# Patient Record
Sex: Female | Born: 1980
Health system: Southern US, Community
[De-identification: ages and names within clinical notes are randomized; demographics above are authoritative.]

## PROBLEM LIST (undated history)

## (undated) ENCOUNTER — Inpatient Hospital Stay (HOSPITAL_COMMUNITY): Payer: Self-pay

## (undated) DIAGNOSIS — E282 Polycystic ovarian syndrome: Secondary | ICD-10-CM

## (undated) DIAGNOSIS — G43009 Migraine without aura, not intractable, without status migrainosus: Secondary | ICD-10-CM

## (undated) HISTORY — DX: Polycystic ovarian syndrome: E28.2

## (undated) HISTORY — DX: Migraine without aura, not intractable, without status migrainosus: G43.009

---
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
## 1999-11-10 ENCOUNTER — Other Ambulatory Visit: Admission: RE | Admit: 1999-11-10 | Discharge: 1999-11-10 | Payer: Self-pay | Admitting: *Deleted

## 2004-08-18 ENCOUNTER — Other Ambulatory Visit: Admission: RE | Admit: 2004-08-18 | Discharge: 2004-08-18 | Payer: Self-pay | Admitting: *Deleted

## 2005-09-01 ENCOUNTER — Other Ambulatory Visit: Admission: RE | Admit: 2005-09-01 | Discharge: 2005-09-01 | Payer: Self-pay | Admitting: Obstetrics & Gynecology

## 2006-09-21 ENCOUNTER — Other Ambulatory Visit: Admission: RE | Admit: 2006-09-21 | Discharge: 2006-09-21 | Payer: Self-pay | Admitting: Obstetrics and Gynecology

## 2007-10-09 ENCOUNTER — Other Ambulatory Visit: Admission: RE | Admit: 2007-10-09 | Discharge: 2007-10-09 | Payer: Self-pay | Admitting: Obstetrics and Gynecology

## 2008-01-11 ENCOUNTER — Ambulatory Visit: Payer: Self-pay

## 2008-01-11 DIAGNOSIS — M7750 Other enthesopathy of unspecified foot: Secondary | ICD-10-CM | POA: Insufficient documentation

## 2008-01-11 DIAGNOSIS — M2170 Unequal limb length (acquired), unspecified site: Secondary | ICD-10-CM | POA: Insufficient documentation

## 2008-01-11 DIAGNOSIS — M25569 Pain in unspecified knee: Secondary | ICD-10-CM | POA: Insufficient documentation

## 2008-01-13 ENCOUNTER — Encounter: Payer: Self-pay | Admitting: Sports Medicine

## 2008-01-14 ENCOUNTER — Encounter: Payer: Self-pay | Admitting: Sports Medicine

## 2010-06-20 DIAGNOSIS — G43009 Migraine without aura, not intractable, without status migrainosus: Secondary | ICD-10-CM

## 2010-06-20 HISTORY — DX: Migraine without aura, not intractable, without status migrainosus: G43.009

## 2012-10-03 ENCOUNTER — Telehealth: Payer: Self-pay | Admitting: Nurse Practitioner

## 2012-10-03 NOTE — Telephone Encounter (Signed)
Patient needs refill extended  until her appointment.

## 2012-10-03 NOTE — Telephone Encounter (Signed)
S/w pt she said she checked her calender and she will have enough to last her until her AEX 10/25/12

## 2012-10-25 ENCOUNTER — Ambulatory Visit (INDEPENDENT_AMBULATORY_CARE_PROVIDER_SITE_OTHER): Payer: Managed Care, Other (non HMO) | Admitting: Nurse Practitioner

## 2012-10-25 ENCOUNTER — Encounter: Payer: Self-pay | Admitting: Nurse Practitioner

## 2012-10-25 VITALS — BP 122/80 | HR 68 | Ht 64.0 in | Wt 224.0 lb

## 2012-10-25 DIAGNOSIS — Z01419 Encounter for gynecological examination (general) (routine) without abnormal findings: Secondary | ICD-10-CM

## 2012-10-25 MED ORDER — NORETHIN ACE-ETH ESTRAD-FE 1.5-30 MG-MCG PO TABS: 1.0000 | ORAL_TABLET | Freq: Every day | ORAL | Status: DC

## 2012-10-25 NOTE — Patient Instructions (Signed)
General topics  Next pap or exam is  due in 1 year Take a Women's multivitamin Take 1200 mg. of calcium daily - prefer dietary If any concerns in interim to call back  Breast Self-Awareness Practicing breast self-awareness may pick up problems early, prevent significant medical complications, and possibly save your life. By practicing breast self-awareness, you can become familiar with how your breasts look and feel and if your breasts are changing. This allows you to notice changes early. It can also offer you some reassurance that your breast health is good. One way to learn what is normal for your breasts and whether your breasts are changing is to do a breast self-exam. If you find a lump or something that was not present in the past, it is best to contact your caregiver right away. Other findings that should be evaluated by your caregiver include nipple discharge, especially if it is bloody; skin changes or reddening; areas where the skin seems to be pulled in (retracted); or new lumps and bumps. Breast pain is seldom associated with cancer (malignancy), but should also be evaluated by a caregiver. BREAST SELF-EXAM The best time to examine your breasts is 5 7 days after your menstrual period is over.  ExitCare Patient Information 2013 [employer].   Exercise to Stay Healthy Exercise helps you become and stay healthy. EXERCISE IDEAS AND TIPS Choose exercises that:  You enjoy.  Fit into your day. You do not need to exercise really hard to be healthy. You can do exercises at a slow or medium level and stay healthy. You can:  Stretch before and after working out.  Try yoga, Pilates, or tai chi.  Lift weights.  Walk fast, swim, jog, run, climb stairs, bicycle, dance, or rollerskate.  Take aerobic classes. Exercises that burn about 150 calories:  Running 1  miles in 15 minutes.  Playing volleyball for 45 to 60 minutes.  Washing and waxing a car for 45 to 60  minutes.  Playing touch football for 45 minutes.  Walking 1  miles in 35 minutes.  Pushing a stroller 1  miles in 30 minutes.  Playing basketball for 30 minutes.  Raking leaves for 30 minutes.  Bicycling 5 miles in 30 minutes.  Walking 2 miles in 30 minutes.  Dancing for 30 minutes.  Shoveling snow for 15 minutes.  Swimming laps for 20 minutes.  Walking up stairs for 15 minutes.  Bicycling 4 miles in 15 minutes.  Gardening for 30 to 45 minutes.  Jumping rope for 15 minutes.  Washing windows or floors for 45 to 60 minutes. Document Released: 04/09/2010 Document Revised: 05/30/2011 Document Reviewed: 04/09/2010 ExitCare Patient Information 2013 [employer].   Other topics ( that may be useful information):    Sexually Transmitted Disease Sexually transmitted disease (STD) refers to any infection that is passed from person to person during sexual activity. This may happen by way of saliva, semen, blood, vaginal mucus, or urine. Common STDs include:  Gonorrhea.  Chlamydia.  Syphilis.  HIV/AIDS.  Genital herpes.  Hepatitis B and C.  Trichomonas.  Human papillomavirus (HPV).  Pubic lice. CAUSES  An STD may be spread by bacteria, virus, or parasite. A person can get an STD by:  Sexual intercourse with an infected person.  Sharing sex toys with an infected person.  Sharing needles with an infected person.  Having intimate contact with the genitals, mouth, or rectal areas of an infected person. SYMPTOMS  Some people may not have any symptoms, but   they can still pass the infection to others. Different STDs have different symptoms. Symptoms include:  Painful or bloody urination.  Pain in the pelvis, abdomen, vagina, anus, throat, or eyes.  Skin rash, itching, irritation, growths, or sores (lesions). These usually occur in the genital or anal area.  Abnormal vaginal discharge.  Penile discharge in men.  Soft, flesh-colored skin growths in the  genital or anal area.  Fever.  Pain or bleeding during sexual intercourse.  Swollen glands in the groin area.  Yellow skin and eyes (jaundice). This is seen with hepatitis. DIAGNOSIS  To make a diagnosis, your caregiver may:  Take a medical history.  Perform a physical exam.  Take a specimen (culture) to be examined.  Examine a sample of discharge under a microscope.  Perform blood test TREATMENT   Chlamydia, gonorrhea, trichomonas, and syphilis can be cured with antibiotic medicine.  Genital herpes, hepatitis, and HIV can be treated, but not cured, with prescribed medicines. The medicines will lessen the symptoms.  Genital warts from HPV can be treated with medicine or by freezing, burning (electrocautery), or surgery. Warts may come back.  HPV is a virus and cannot be cured with medicine or surgery.However, abnormal areas may be followed very closely by your caregiver and may be removed from the cervix, vagina, or vulva through office procedures or surgery. If your diagnosis is confirmed, your recent sexual partners need treatment. This is true even if they are symptom-free or have a negative culture or evaluation. They should not have sex until their caregiver says it is okay. HOME CARE INSTRUCTIONS  All sexual partners should be informed, tested, and treated for all STDs.  Take your antibiotics as directed. Finish them even if you start to feel better.  Only take over-the-counter or prescription medicines for pain, discomfort, or fever as directed by your caregiver.  Rest.  Eat a balanced diet and drink enough fluids to keep your urine clear or pale yellow.  Do not have sex until treatment is completed and you have followed up with your caregiver. STDs should be checked after treatment.  Keep all follow-up appointments, Pap tests, and blood tests as directed by your caregiver.  Only use latex condoms and water-soluble lubricants during sexual activity. Do not use  petroleum jelly or oils.  Avoid alcohol and illegal drugs.  Get vaccinated for HPV and hepatitis. If you have not received these vaccines in the past, talk to your caregiver about whether one or both might be right for you.  Avoid risky sex practices that can break the skin. The only way to avoid getting an STD is to avoid all sexual activity.Latex condoms and dental dams (for oral sex) will help lessen the risk of getting an STD, but will not completely eliminate the risk. SEEK MEDICAL CARE IF:   You have a fever.  You have any new or worsening symptoms. Document Released: 05/28/2002 Document Revised: 05/30/2011 Document Reviewed: 06/04/2010 ExitCare Patient Information 2013 [employer].    Domestic Abuse You are being battered or abused if someone close to you hits, pushes, or physically hurts you in any way. You also are being abused if you are forced into activities. You are being sexually abused if you are forced to have sexual contact of any kind. You are being emotionally abused if you are made to feel worthless or if you are constantly threatened. It is important to remember that help is available. No one has the right to abuse you. PREVENTION OF FURTHER   ABUSE  Learn the warning signs of danger. This varies with situations but may include: the use of alcohol, threats, isolation from friends and family, or forced sexual contact. Leave if you feel that violence is going to occur.  If you are attacked or beaten, report it to the police so the abuse is documented. You do not have to press charges. The police can protect you while you or the attackers are leaving. Get the officer's name and badge number and a copy of the report.  Find someone you can trust and tell them what is happening to you: your caregiver, a nurse, clergy member, close friend or family member. Feeling ashamed is natural, but remember that you have done nothing wrong. No one deserves abuse. Document Released:  03/04/2000 Document Revised: 05/30/2011 Document Reviewed: 05/13/2010 ExitCare Patient Information 2013 [employer].    How Much is Too Much Alcohol? Drinking too much alcohol can cause injury, accidents, and health problems. These types of problems can include:   Car crashes.  Falls.  Family fighting (domestic violence).  Drowning.  Fights.  Injuries.  Burns.  Damage to certain organs.  Having a baby with birth defects. ONE DRINK CAN BE TOO MUCH WHEN YOU ARE:  Working.  Pregnant or breastfeeding.  Taking medicines. Ask your doctor.  Driving or planning to drive. If you or someone you know has a drinking problem, get help from a doctor.  Document Released: 01/01/2009 Document Revised: 05/30/2011 Document Reviewed: 01/01/2009 ExitCare Patient Information 2013 [employer].   Smoking Hazards Smoking cigarettes is extremely bad for your health. Tobacco smoke has over 200 known poisons in it. There are over 60 chemicals in tobacco smoke that cause cancer. Some of the chemicals found in cigarette smoke include:   Cyanide.  Benzene.  Formaldehyde.  Methanol (wood alcohol).  Acetylene (fuel used in welding torches).  Ammonia. Cigarette smoke also contains the poisonous gases nitrogen oxide and carbon monoxide.  Cigarette smokers have an increased risk of many serious medical problems and Smoking causes approximately:  90% of all lung cancer deaths in men.  80% of all lung cancer deaths in women.  90% of deaths from chronic obstructive lung disease. Compared with nonsmokers, smoking increases the risk of:  Coronary heart disease by 2 to 4 times.  Stroke by 2 to 4 times.  Men developing lung cancer by 23 times.  Women developing lung cancer by 13 times.  Dying from chronic obstructive lung diseases by 12 times.  . Smoking is the most preventable cause of death and disease in our society.  WHY IS SMOKING ADDICTIVE?  Nicotine is the chemical  agent in tobacco that is capable of causing addiction or dependence.  When you smoke and inhale, nicotine is absorbed rapidly into the bloodstream through your lungs. Nicotine absorbed through the lungs is capable of creating a powerful addiction. Both inhaled and non-inhaled nicotine may be addictive.  Addiction studies of cigarettes and spit tobacco show that addiction to nicotine occurs mainly during the teen years, when young people begin using tobacco products. WHAT ARE THE BENEFITS OF QUITTING?  There are many health benefits to quitting smoking.   Likelihood of developing cancer and heart disease decreases. Health improvements are seen almost immediately.  Blood pressure, pulse rate, and breathing patterns start returning to normal soon after quitting. QUITTING SMOKING   American Lung Association - 1-800-LUNGUSA  American Cancer Society - 1-800-ACS-2345 Document Released: 04/14/2004 Document Revised: 05/30/2011 Document Reviewed: 12/17/2008 ExitCare Patient Information 2013 [employer].   Stress Management Stress is a state of physical or mental tension that often results from changes in your life or normal routine. Some common causes of stress are:  Death of a loved one.  Injuries or severe illnesses.  Getting fired or changing jobs.  Moving into a new home. Other causes may be:  Sexual problems.  Business or financial losses.  Taking on a large debt.  Regular conflict with someone at home or at work.  Constant tiredness from lack of sleep. It is not just bad things that are stressful. It may be stressful to:  Win the lottery.  Get married.  Buy a new car. The amount of stress that can be easily tolerated varies from person to person. Changes generally cause stress, regardless of the types of change. Too much stress can affect your health. It may lead to physical or emotional problems. Too little stress (boredom) may also become stressful. SUGGESTIONS TO  REDUCE STRESS:  Talk things over with your family and friends. It often is helpful to share your concerns and worries. If you feel your problem is serious, you may want to get help from a professional counselor.  Consider your problems one at a time instead of lumping them all together. Trying to take care of everything at once may seem impossible. List all the things you need to do and then start with the most important one. Set a goal to accomplish 2 or 3 things each day. If you expect to do too many in a single day you will naturally fail, causing you to feel even more stressed.  Do not use alcohol or drugs to relieve stress. Although you may feel better for a short time, they do not remove the problems that caused the stress. They can also be habit forming.  Exercise regularly - at least 3 times per week. Physical exercise can help to relieve that "uptight" feeling and will relax you.  The shortest distance between despair and hope is often a good night's sleep.  Go to bed and get up on time allowing yourself time for appointments without being rushed.  Take a short "time-out" period from any stressful situation that occurs during the day. Close your eyes and take some deep breaths. Starting with the muscles in your face, tense them, hold it for a few seconds, then relax. Repeat this with the muscles in your neck, shoulders, hand, stomach, back and legs.  Take good care of yourself. Eat a balanced diet and get plenty of rest.  Schedule time for having fun. Take a break from your daily routine to relax. HOME CARE INSTRUCTIONS   Call if you feel overwhelmed by your problems and feel you can no longer manage them on your own.  Return immediately if you feel like hurting yourself or someone else. Document Released: 08/31/2000 Document Revised: 05/30/2011 Document Reviewed: 04/23/2007 ExitCare Patient Information 2013 [employer].   

## 2012-10-25 NOTE — Progress Notes (Signed)
Patient ID: Diamond Hunter, female   DOB: 01-19-1981, 32 y.o.   MRN: [id number] 32 y.o. G0 Married Caucasian Fe here for annual exam.    Patient's last menstrual period was 10/22/2012.          Sexually active: yes  The current method of family planning is OCP (estrogen/progesterone).    Exercising: no  The patient does not participate in regular exercise at present. Smoker:  no  Health Maintenance: Pap:  8/13= Normal with negative HR HPV TDaP:  2011 Gardasil completed 03/03/06 Labs: HB: 14.0 Urine: lg RBC-on menses   reports that she has never smoked. She has never used smokeless tobacco. She reports that she drinks about 2.5 ounces of alcohol per week. She reports that she does not use illicit drugs.  History reviewed. No pertinent past medical history.  History reviewed. No pertinent past surgical history.  Current Outpatient Prescriptions  Medication Sig Dispense Refill  . norethindrone-ethinyl estradiol-iron (MICROGESTIN FE,GILDESS FE,LOESTRIN FE) 1.5-30 MG-MCG tablet Take 1 tablet by mouth daily.       No current facility-administered medications for this visit.    Family History  Problem Relation Age of Onset  . Cancer Father     bladder cancer    ROS:  Pertinent items are noted in HPI.  Otherwise, a comprehensive ROS was negative.  Exam:   BP 122/80  Pulse 68  Ht 5\' 4"  (1.626 m)  Wt 224 lb (101.606 kg)  BMI 38.43 kg/m2  LMP 10/22/2012 Height: 5\' 4"  (162.6 cm)  Ht Readings from Last 3 Encounters:  10/25/12 5\' 4"  (1.626 m)    General appearance: alert, cooperative and appears stated age Head: Normocephalic, without obvious abnormality, atraumatic Neck: no adenopathy, supple, symmetrical, trachea midline and thyroid normal to inspection and palpation Lungs: clear to auscultation bilaterally Breasts: normal appearance, no masses or tenderness Heart: regular rate and rhythm Abdomen: soft, non-tender; no masses,  no organomegaly Extremities: extremities normal,  atraumatic, no cyanosis or edema Skin: Skin color, texture, turgor normal. No rashes or lesions Lymph nodes: Cervical, supraclavicular, and axillary nodes normal. No abnormal inguinal nodes palpated Neurologic: Grossly normal   Pelvic: External genitalia:  no lesions              Urethra:  normal appearing urethra with no masses, tenderness or lesions              Bartholin's and Skene's: normal                 Vagina: normal appearing vagina with normal color and discharge, no lesions              Cervix: anteverted              Pap taken: no Bimanual Exam:  Uterus:  normal size, contour, position, consistency, mobility, non-tender              Adnexa: no mass, fullness, tenderness               Rectovaginal: Confirms               Anus:  normal sphincter tone, no lesions  A:  Well Woman with normal exam  OCP for contraception  History of PCOS  P:   Pap smear as per guidelines  Refill on Microgestin for 1 year  Answered questions about future pregnancy and PCOS   Counseled on breast self exam, adequate intake of calcium and vitamin D, diet and exercise return annually or  prn  An After Visit Summary was printed and given to the patient.

## 2012-10-26 NOTE — Progress Notes (Signed)
Encounter reviewed by Dr. Brook Silva.  

## 2013-09-25 ENCOUNTER — Other Ambulatory Visit: Payer: Self-pay | Admitting: Nurse Practitioner

## 2013-09-25 NOTE — Telephone Encounter (Signed)
Last AEX: 10/25/12 Last refill: 10/25/12 # 84, 3 refills Current AEX: 10/28/13  Spoke to pt and she stated she just needed 1 mth to cover until her AEX in Aug.  Encounter closed

## 2013-10-28 ENCOUNTER — Encounter: Payer: Self-pay | Admitting: Nurse Practitioner

## 2013-10-28 ENCOUNTER — Ambulatory Visit (INDEPENDENT_AMBULATORY_CARE_PROVIDER_SITE_OTHER): Payer: Managed Care, Other (non HMO) | Admitting: Nurse Practitioner

## 2013-10-28 VITALS — BP 116/72 | HR 64 | Ht 64.0 in | Wt 223.0 lb

## 2013-10-28 DIAGNOSIS — Z0000 Encounter for general adult medical examination without abnormal findings: Secondary | ICD-10-CM

## 2013-10-28 DIAGNOSIS — Z01419 Encounter for gynecological examination (general) (routine) without abnormal findings: Secondary | ICD-10-CM

## 2013-10-28 DIAGNOSIS — D508 Other iron deficiency anemias: Secondary | ICD-10-CM

## 2013-10-28 LAB — POCT URINALYSIS DIPSTICK
Bilirubin, UA: NEGATIVE
Blood, UA: NEGATIVE
Glucose, UA: NEGATIVE
Ketones, UA: NEGATIVE
LEUKOCYTES UA: NEGATIVE
Nitrite, UA: NEGATIVE
PROTEIN UA: NEGATIVE
UROBILINOGEN UA: NEGATIVE
pH, UA: 7

## 2013-10-28 LAB — CBC
HEMATOCRIT: 41.8 % (ref 36.0–46.0)
Hemoglobin: 13.7 g/dL (ref 12.0–15.0)
MCH: 30.4 pg (ref 26.0–34.0)
MCHC: 32.8 g/dL (ref 30.0–36.0)
MCV: 92.7 fL (ref 78.0–100.0)
Platelets: 326 10*3/uL (ref 150–400)
RBC: 4.51 MIL/uL (ref 3.87–5.11)
RDW: 13 % (ref 11.5–15.5)
WBC: 5.8 10*3/uL (ref 4.0–10.5)

## 2013-10-28 LAB — HEMOGLOBIN, FINGERSTICK: HEMOGLOBIN, FINGERSTICK: 9.3 g/dL — AB (ref 12.0–16.0)

## 2013-10-28 MED ORDER — MICROGESTIN 1/20 1-20 MG-MCG PO TABS: ORAL_TABLET | ORAL | Status: DC

## 2013-10-28 NOTE — Patient Instructions (Signed)

## 2013-10-28 NOTE — Progress Notes (Signed)
Patient ID: Diamond KiltsKathryn J Hunter, female   DOB: 1980/07/23, 33 y.o.   MRN: [id number] [age] y.o. G0P0 Married Caucasian Fe here for annual exam.  Menses on Microgestin  lasting 4 days with 1 day heavy.  On Gildess menses is 7 days with 2 days heavier and increase of HA's.  Same partner for 3 years.  will  Be going back to school in 2 years and take interior design.  They are going to postpone planning a family  Patient's last menstrual period was 10/21/2013.          Sexually active: Yes.    The current method of family planning is OCP (estrogen/progesterone).    Exercising: Yes.    Gym/ health club routine includes yoga and pilates and walking. Smoker:  no  Health Maintenance: Pap:  8/13= Normal with negative HR HPV TDaP:  2011 Gardasil:  Completed 03/03/06 Labs: HB:  9.3  Urine:  Negative    reports that she has never smoked. She has never used smokeless tobacco. She reports that she drinks about 2.5 ounces of alcohol per week. She reports that she does not use illicit drugs.  Past Medical History  Diagnosis Date  . Migraine headache without aura 06/2010  . PCOS (polycystic ovarian syndrome)     suspected    Past Surgical History  Procedure Laterality Date  . Wisdom tooth extraction  1995    Current Outpatient Prescriptions  Medication Sig Dispense Refill  . GILDESS FE 1.5/30 1.5-30 MG-MCG tablet TAKE 1 TABLET BY MOUTH DAILY  1 Package  0   No current facility-administered medications for this visit.    Family History  Problem Relation Age of Onset  . Bladder Cancer Father 2555    bladder cancer  . Heart failure Maternal Grandmother   . Diabetes Maternal Grandmother   . Heart failure Paternal Grandmother   . Hyperlipidemia Paternal Grandmother   . Hypertension Paternal Grandmother   . Lung cancer Paternal Grandfather     smoker    ROS:  Pertinent items are noted in HPI.  Otherwise, a comprehensive ROS was negative.  Exam:   BP 116/72  Pulse 64  Ht 5\' 4"  (1.626 m)  Wt 223  lb (101.152 kg)  BMI 38.26 kg/m2  LMP 10/21/2013 Height: 5\' 4"  (162.6 cm)  Ht Readings from Last 3 Encounters:  10/28/13 5\' 4"  (1.626 m)  10/25/12 5\' 4"  (1.626 m)    General appearance: alert, cooperative and appears stated age Head: Normocephalic, without obvious abnormality, atraumatic Neck: no adenopathy, supple, symmetrical, trachea midline and thyroid normal to inspection and palpation Lungs: clear to auscultation bilaterally Breasts: normal appearance, no masses or tenderness Heart: regular rate and rhythm Abdomen: soft, non-tender; no masses,  no organomegaly Extremities: extremities normal, atraumatic, no cyanosis or edema Skin: Skin color, texture, turgor normal. No rashes or lesions Lymph nodes: Cervical, supraclavicular, and axillary nodes normal. No abnormal inguinal nodes palpated Neurologic: Grossly normal   Pelvic: External genitalia:  no lesions              Urethra:  normal appearing urethra with no masses, tenderness or lesions              Bartholin's and Skene's: normal                 Vagina: normal appearing vagina with normal color and discharge, no lesions              Cervix: anteverted  Pap taken: No. Bimanual Exam:  Uterus:  normal size, contour, position, consistency, mobility, non-tender              Adnexa: no mass, fullness, tenderness               Rectovaginal: Confirms               Anus:  normal sphincter tone, no lesions  A:  Well Woman with normal exam  OCP for contraception  History of PCOS  R/O anemia  P:   Reviewed health and wellness pertinent to exam  Pap smear not taken today  Refill on Microgestin 1/20 daily for a year DAW  Will follow with iron studies  Counseled on breast self exam, adequate intake of calcium and vitamin D, diet and exercise return annually or prn  An After Visit Summary was printed and given to the patient.

## 2013-10-29 LAB — IBC PANEL
%SAT: 55 % (ref 20–55)
TIBC: 394 ug/dL (ref 250–470)
UIBC: 178 ug/dL (ref 125–400)

## 2013-10-29 LAB — FERRITIN: Ferritin: 27 ng/mL (ref 10–291)

## 2013-10-29 LAB — IRON: Iron: 216 ug/dL — ABNORMAL HIGH (ref 42–145)

## 2013-10-30 ENCOUNTER — Ambulatory Visit: Payer: Managed Care, Other (non HMO) | Admitting: Nurse Practitioner

## 2013-11-03 NOTE — Progress Notes (Signed)
Encounter reviewed by Dr. Brook Silva.  

## 2014-01-27 ENCOUNTER — Ambulatory Visit (INDEPENDENT_AMBULATORY_CARE_PROVIDER_SITE_OTHER): Payer: Managed Care, Other (non HMO) | Admitting: Nurse Practitioner

## 2014-01-27 ENCOUNTER — Encounter: Payer: Self-pay | Admitting: Emergency Medicine

## 2014-01-27 ENCOUNTER — Emergency Department (HOSPITAL_COMMUNITY)
Admission: EM | Admit: 2014-01-27 | Discharge: 2014-01-27 | Disposition: A | Discharge status: Home / Self Care | Payer: Managed Care, Other (non HMO) | Attending: Emergency Medicine | Admitting: Emergency Medicine

## 2014-01-27 ENCOUNTER — Encounter (HOSPITAL_COMMUNITY): Payer: Self-pay

## 2014-01-27 ENCOUNTER — Emergency Department (HOSPITAL_COMMUNITY): Payer: Managed Care, Other (non HMO)

## 2014-01-27 ENCOUNTER — Encounter: Payer: Self-pay | Admitting: Nurse Practitioner

## 2014-01-27 ENCOUNTER — Telehealth: Payer: Self-pay

## 2014-01-27 VITALS — BP 122/64 | HR 64 | Temp 98.8°F | Ht 64.0 in | Wt 228.0 lb

## 2014-01-27 DIAGNOSIS — R1031 Right lower quadrant pain: Secondary | ICD-10-CM

## 2014-01-27 DIAGNOSIS — R110 Nausea: Secondary | ICD-10-CM | POA: Diagnosis not present

## 2014-01-27 DIAGNOSIS — Z793 Long term (current) use of hormonal contraceptives: Secondary | ICD-10-CM | POA: Insufficient documentation

## 2014-01-27 DIAGNOSIS — E282 Polycystic ovarian syndrome: Secondary | ICD-10-CM | POA: Insufficient documentation

## 2014-01-27 DIAGNOSIS — R197 Diarrhea, unspecified: Secondary | ICD-10-CM | POA: Diagnosis not present

## 2014-01-27 DIAGNOSIS — E669 Obesity, unspecified: Secondary | ICD-10-CM | POA: Diagnosis not present

## 2014-01-27 DIAGNOSIS — R102 Pelvic and perineal pain: Secondary | ICD-10-CM

## 2014-01-27 DIAGNOSIS — Z8679 Personal history of other diseases of the circulatory system: Secondary | ICD-10-CM | POA: Diagnosis not present

## 2014-01-27 DIAGNOSIS — R630 Anorexia: Secondary | ICD-10-CM | POA: Insufficient documentation

## 2014-01-27 LAB — CBC WITH DIFFERENTIAL/PLATELET
BASOS ABS: 0 10*3/uL (ref 0.0–0.1)
BASOS PCT: 0 % (ref 0–1)
EOS ABS: 0.1 10*3/uL (ref 0.0–0.7)
Eosinophils Relative: 1 % (ref 0–5)
HCT: 40.4 % (ref 36.0–46.0)
HEMOGLOBIN: 13.5 g/dL (ref 12.0–15.0)
Lymphocytes Relative: 41 % (ref 12–46)
Lymphs Abs: 3.3 10*3/uL (ref 0.7–4.0)
MCH: 30.4 pg (ref 26.0–34.0)
MCHC: 33.4 g/dL (ref 30.0–36.0)
MCV: 91 fL (ref 78.0–100.0)
Monocytes Absolute: 0.5 10*3/uL (ref 0.1–1.0)
Monocytes Relative: 6 % (ref 3–12)
NEUTROS PCT: 52 % (ref 43–77)
Neutro Abs: 4.2 10*3/uL (ref 1.7–7.7)
PLATELETS: 362 10*3/uL (ref 150–400)
RBC: 4.44 MIL/uL (ref 3.87–5.11)
RDW: 12.2 % (ref 11.5–15.5)
WBC: 8.1 10*3/uL (ref 4.0–10.5)

## 2014-01-27 LAB — POCT URINALYSIS DIPSTICK
BILIRUBIN UA: NEGATIVE
GLUCOSE UA: NEGATIVE
KETONES UA: NEGATIVE
Nitrite, UA: NEGATIVE
PH UA: 6
Protein, UA: NEGATIVE
Urobilinogen, UA: NEGATIVE

## 2014-01-27 LAB — COMPREHENSIVE METABOLIC PANEL
ALBUMIN: 3.7 g/dL (ref 3.5–5.2)
ALK PHOS: 39 U/L (ref 39–117)
ALT: 12 U/L (ref 0–35)
ANION GAP: 14 (ref 5–15)
AST: 17 U/L (ref 0–37)
BUN: 12 mg/dL (ref 6–23)
CO2: 21 mEq/L (ref 19–32)
Calcium: 9.5 mg/dL (ref 8.4–10.5)
Chloride: 106 mEq/L (ref 96–112)
Creatinine, Ser: 0.74 mg/dL (ref 0.50–1.10)
GFR calc Af Amer: >90 mL/min (ref >90)
GFR calc non Af Amer: >90 mL/min (ref >90)
Glucose, Bld: 90 mg/dL (ref 70–99)
POTASSIUM: 3.9 meq/L (ref 3.7–5.3)
Sodium: 141 mEq/L (ref 137–147)
TOTAL PROTEIN: 7.3 g/dL (ref 6.0–8.3)
Total Bilirubin: 0.3 mg/dL (ref 0.3–1.2)

## 2014-01-27 LAB — LIPASE, BLOOD: Lipase: 42 U/L (ref 11–59)

## 2014-01-27 LAB — POCT URINE PREGNANCY: Preg Test, Ur: NEGATIVE

## 2014-01-27 MED ORDER — IOHEXOL 300 MG/ML  SOLN
100.0000 mL | Freq: Once | INTRAMUSCULAR | Status: AC | PRN
  Administered 2014-01-27: 100 mL via INTRAVENOUS

## 2014-01-27 MED ORDER — MORPHINE SULFATE 4 MG/ML IJ SOLN
4.0000 mg | Freq: Once | INTRAMUSCULAR | Status: DC
  Filled 2014-01-27: qty 1

## 2014-01-27 MED ORDER — SODIUM CHLORIDE 0.9 % IV BOLUS (SEPSIS)
1000.0000 mL | Freq: Once | INTRAVENOUS | Status: AC
Start: 2014-01-27 — End: 2014-01-27
  Administered 2014-01-27: 1000 mL via INTRAVENOUS

## 2014-01-27 MED ORDER — IOHEXOL 300 MG/ML  SOLN
50.0000 mL | Freq: Once | INTRAMUSCULAR | Status: AC | PRN
  Administered 2014-01-27: 50 mL via ORAL

## 2014-01-27 MED ORDER — ONDANSETRON HCL 4 MG/2ML IJ SOLN: 4.0000 mg | Freq: Once | INTRAMUSCULAR | Status: DC

## 2014-01-27 NOTE — ED Provider Notes (Signed)
The patient presents with abdominal pain specifically in the right lower quadrant, this is much better since earlier today, she was seen at a gynecologist appointment, had some hematuria on urinalysis and was sent to the emergency department for further evaluation. The patient does not have a leukocytosis and on exam she has minimal right lower quadrant pain but this is persistent. She is not expected to have her menstrual cycle for another week. She does have pain at the right lower quadrant at McBurney's point, there is no guarding, no other signs including no Rovsing sign, no rigidity and no surgical abdomen. We'll pursue CT scan to rule out other sources of pain, patient appears hemodynamically stable at this time.  Medical screening examination/treatment/procedure(s) were conducted as a shared visit with non-physician practitioner(s) and myself.  I personally evaluated the patient during the encounter.  Clinical Impression:   Final diagnoses:  RLQ abdominal pain  RLQ abdominal pain         Vida RollerBrian D Jaynia Fendley, MD 01/28/14 [phone]

## 2014-01-27 NOTE — ED Notes (Addendum)
Pt c/o dull lower abdominal pain x 1 week and intermittent, sharp, RLQ pain starting this morning.  Pain score 5/10.  Denies n/v/d.  [facility] and sent to ED to r/o appendicitis.

## 2014-01-27 NOTE — Discharge Instructions (Signed)
Abdominal Pain, Women °Abdominal (stomach, pelvic, or belly) pain can be caused by many things. It is important to tell your doctor: °· The location of the pain. °· Does it come and go or is it present all the time? °· Are there things that start the pain (eating certain foods, exercise)? °· Are there other symptoms associated with the pain (fever, nausea, vomiting, diarrhea)? °All of this is helpful to know when trying to find the cause of the pain. °CAUSES  °· Stomach: virus or bacteria infection, or ulcer. °· Intestine: appendicitis (inflamed appendix), regional ileitis (Crohn's disease), ulcerative colitis (inflamed colon), irritable bowel syndrome, diverticulitis (inflamed diverticulum of the colon), or cancer of the stomach or intestine. °· Gallbladder disease or stones in the gallbladder. °· Kidney disease, kidney stones, or infection. °· Pancreas infection or cancer. °· Fibromyalgia (pain disorder). °· Diseases of the female organs: °¨ Uterus: fibroid (non-cancerous) tumors or infection. °¨ Fallopian tubes: infection or tubal pregnancy. °¨ Ovary: cysts or tumors. °¨ Pelvic adhesions (scar tissue). °¨ Endometriosis (uterus lining tissue growing in the pelvis and on the pelvic organs). °¨ Pelvic congestion syndrome (female organs filling up with blood just before the menstrual period). °¨ Pain with the menstrual period. °¨ Pain with ovulation (producing an egg). °¨ Pain with an IUD (intrauterine device, birth control) in the uterus. °¨ Cancer of the female organs. °· Functional pain (pain not caused by a disease, may improve without treatment). °· Psychological pain. °· Depression. °DIAGNOSIS  °Your doctor will decide the seriousness of your pain by doing an examination. °· Blood tests. °· X-rays. °· Ultrasound. °· CT scan (computed tomography, special type of X-ray). °· MRI (magnetic resonance imaging). °· Cultures, for infection. °· Barium enema (dye inserted in the large intestine, to better view it with  X-rays). °· Colonoscopy (looking in intestine with a lighted tube). °· Laparoscopy (minor surgery, looking in abdomen with a lighted tube). °· Major abdominal exploratory surgery (looking in abdomen with a large incision). °TREATMENT  °The treatment will depend on the cause of the pain.  °· Many cases can be observed and treated at home. °· Over-the-counter medicines recommended by your caregiver. °· Prescription medicine. °· Antibiotics, for infection. °· Birth control pills, for painful periods or for ovulation pain. °· Hormone treatment, for endometriosis. °· Nerve blocking injections. °· Physical therapy. °· Antidepressants. °· Counseling with a psychologist or psychiatrist. °· Minor or major surgery. °HOME CARE INSTRUCTIONS  °· Do not take laxatives, unless directed by your caregiver. °· Take over-the-counter pain medicine only if ordered by your caregiver. Do not take aspirin because it can cause an upset stomach or bleeding. °· Try a clear liquid diet (broth or water) as ordered by your caregiver. Slowly move to a bland diet, as tolerated, if the pain is related to the stomach or intestine. °· Have a thermometer and take your temperature several times a day, and record it. °· Bed rest and sleep, if it helps the pain. °· Avoid sexual intercourse, if it causes pain. °· Avoid stressful situations. °· Keep your follow-up appointments and tests, as your caregiver orders. °· If the pain does not go away with medicine or surgery, you may try: °¨ Acupuncture. °¨ Relaxation exercises (yoga, meditation). °¨ Group therapy. °¨ Counseling. °SEEK MEDICAL CARE IF:  °· You notice certain foods cause stomach pain. °· Your home care treatment is not helping your pain. °· You need stronger pain medicine. °· You want your IUD removed. °· You feel faint or   lightheaded. °· You develop nausea and vomiting. °· You develop a rash. °· You are having side effects or an allergy to your medicine. °SEEK IMMEDIATE MEDICAL CARE IF:  °· Your  pain does not go away or gets worse. °· You have a fever. °· Your pain is felt only in portions of the abdomen. The right side could possibly be appendicitis. The left lower portion of the abdomen could be colitis or diverticulitis. °· You are passing blood in your stools (bright red or black tarry stools, with or without vomiting). °· You have blood in your urine. °· You develop chills, with or without a fever. °· You pass out. °MAKE SURE YOU:  °· Understand these instructions. °· Will watch your condition. °· Will get help right away if you are not doing well or get worse. °Document Released: 01/02/2007 Document Revised: 07/22/2013 Document Reviewed: 01/22/2009 °ExitCare® Patient Information ©2015 [employer]. This information is not intended to replace advice given to you by your health care provider. Make sure you discuss any questions you have with your health care provider. ° °

## 2014-01-27 NOTE — Progress Notes (Signed)
[facility] and advised charge RN Alena patient coming via personal vehicle for evaluation of RLQ pain/Pelvic pain. UPT negative. LMP 01/07/14.

## 2014-01-27 NOTE — Progress Notes (Signed)
Subjective:     Patient ID: Diamond Hunter, female   DOB: 03-06-1981, 33 y.o.   MRN: [id number]  HPI  This 33 yo G0P0 WM Fe complains of pain lower pelvic for a week.  Pain initially has been dull achy with associated bloating.  She thought maybe stress of working and going to school was the cause of pain.  Her BM's remained the same daily without constipation or increase in flatus.  This am pain was sharp and stabbing but intermittent. She remains on OCP without missed doses.  LMP 01/16/14 with normal flow.  Last SA this past Friday without pain or discomfort.  She went to a wedding on Saturday and remained busy on her feet but this did not cause pain to worsen. Some spotting on Saturday with wiping only.   She denies any urinary symptoms.  No prior history of renal calculi.  She normally does not complain, so today when she called we felt like she was having significant symptoms to warrant an evaluation.  Semi soft BM this am and pain got worse right lower quadrant.   Review of Systems  Constitutional: Negative for fever, chills, appetite change and fatigue.  Respiratory: Negative.   Cardiovascular: Negative.   Gastrointestinal: Positive for abdominal pain. Negative for nausea, vomiting, diarrhea, constipation, blood in stool, abdominal distention, anal bleeding and rectal pain.  Genitourinary: Negative for urgency, frequency, hematuria, flank pain, vaginal bleeding, vaginal discharge, enuresis, vaginal pain, menstrual problem, pelvic pain and dyspareunia.  Musculoskeletal: Positive for back pain. Negative for myalgias, joint swelling and neck pain.       Chronic back pain that is unchanged  Skin: Negative.   Neurological: Negative.   Psychiatric/Behavioral: Negative.        Objective:   Physical Exam  Constitutional: She is oriented to person, place, and time. She appears well-developed and well-nourished. She appears distressed.  Cardiovascular: Normal rate.   Pulmonary/Chest: Effort  normal.  Abdominal: Soft. Bowel sounds are normal. She exhibits no distension and no mass. There is tenderness. There is rebound and guarding.  Genitourinary:  Normal vaginal discharge without bleeding.  Musculoskeletal: Normal range of motion.  Neurological: She is alert and oriented to person, place, and time.  Skin: Skin is warm and dry.  Psychiatric: She has a normal mood and affect. Her behavior is normal. Judgment and thought content normal.       Assessment:     RLQ pain ? etiology    Plan:     Discussed with Dr. Hyacinth MeekerMiller and will [facility] for evaluation for possible renal calculi or appendicitis.

## 2014-01-27 NOTE — ED Provider Notes (Signed)
CSN: [id number]     Arrival date & time 01/27/14  1516 History   First MD Initiated Contact with Patient 01/27/14 1533     Chief Complaint  Patient presents with  . Abdominal Pain     (Consider location/radiation/quality/duration/timing/severity/associated sxs/prior Treatment) HPI Comments: Patient is a 33 year old female with history of PCOS and migraines who presents to the ED for evaluation of abdominal pain. She reports that she has had dull pain in her lower abdomen for the past 5 days. Today the pain became sharp in her RLQ and she went to see her GYN physician. At that time her GYN physician did a pelvic exam and felt as though she needed to have appendicitis ruled out. She reports associated decreased appetite and nausea. She had one loose stool this morning. Patient denies fevers, chills.   The history is provided by the patient. No language interpreter was used.    Past Medical History  Diagnosis Date  . Migraine headache without aura 06/2010  . PCOS (polycystic ovarian syndrome)     suspected   Past Surgical History  Procedure Laterality Date  . Wisdom tooth extraction  1995   Family History  Problem Relation Age of Onset  . Bladder Cancer Father [id number]    bladder cancer  . Heart failure Maternal Grandmother   . Diabetes Maternal Grandmother   . Heart failure Paternal Grandmother   . Hyperlipidemia Paternal Grandmother   . Hypertension Paternal Grandmother   . Lung cancer Paternal Grandfather     smoker   History  Substance Use Topics  . Smoking status: Never Smoker   . Smokeless tobacco: Never Used  . Alcohol Use: 2.5 oz/week    5 drink(s) per week   OB History    Gravida Para Term Preterm AB TAB SAB Ectopic Multiple Living   0              Review of Systems  Constitutional: Positive for appetite change. Negative for fever and chills.  Respiratory: Negative for shortness of breath.   Cardiovascular: Negative for chest pain.  Gastrointestinal: Positive for  nausea, abdominal pain and diarrhea. Negative for vomiting.  Genitourinary: Negative for dysuria, vaginal bleeding and vaginal discharge.  All other systems reviewed and are negative.     Allergies  Review of patient's allergies indicates no known allergies.  Home Medications   Prior to Admission medications   Medication Sig Start Date End Date Taking? Authorizing Provider  MICROGESTIN 1-20 MG-MCG tablet Take continous active pills.  3 months = 4 pack 10/28/13   Patricia Rolen-Grubb, FNP   BP 136/75 mmHg  Pulse 78  Temp(Src) 98.1 F (36.7 C) (Oral)  Resp 16  SpO2 99%  LMP 01/07/2014 Physical Exam  Constitutional: She is oriented to person, place, and time. She appears well-developed and well-nourished. No distress.  obese  HENT:  Head: Normocephalic and atraumatic.  Right Ear: External ear normal.  Left Ear: External ear normal.  Nose: Nose normal.  Mouth/Throat: Oropharynx is clear and moist.  Eyes: Conjunctivae are normal.  Neck: Normal range of motion.  Cardiovascular: Normal rate, regular rhythm and normal heart sounds.   Pulmonary/Chest: Effort normal and breath sounds normal. No stridor. No respiratory distress. She has no wheezes. She has no rales.  Abdominal: Soft. She exhibits no distension. There is tenderness in the right lower quadrant. There is no rigidity and no guarding.  Musculoskeletal: Normal range of motion.  Neurological: She is alert and oriented to person, place,  and time. She has normal strength.  Skin: Skin is warm and dry. She is not diaphoretic. No erythema.  Psychiatric: She has a normal mood and affect. Her behavior is normal.  Nursing note and vitals reviewed.   ED Course  Procedures (including critical care time) Labs Review Labs Reviewed  CBC WITH DIFFERENTIAL  COMPREHENSIVE METABOLIC PANEL  LIPASE, BLOOD    Imaging Review Ct Abdomen Pelvis W Contrast  01/27/2014   CLINICAL DATA:  Right lower quadrant sharp stabbing pain for 2 days.   EXAM: CT ABDOMEN AND PELVIS WITH CONTRAST  TECHNIQUE: Multidetector CT imaging of the abdomen and pelvis was performed using the standard protocol following bolus administration of intravenous contrast.  CONTRAST:  50mL OMNIPAQUE IOHEXOL 300 MG/ML SOLN, 100mL OMNIPAQUE IOHEXOL 300 MG/ML SOLN  COMPARISON:  None.  FINDINGS: BODY WALL: Unremarkable.  LOWER CHEST: Unremarkable.  ABDOMEN/PELVIS:  Liver: No focal abnormality.  Biliary: No evidence of biliary obstruction or stone.  Pancreas: Unremarkable.  Spleen: Unremarkable.  Adrenals: Unremarkable.  Kidneys and ureters: No hydronephrosis or stone.  Bladder: Unremarkable.  Reproductive: Unremarkable.  Bowel: No obstruction. No inflammatory bowel wall thickening. Negative appendix.  Retroperitoneum: No mass or adenopathy.  Peritoneum: No ascites or pneumoperitoneum.  Vascular: No acute abnormality.  OSSEOUS: No acute abnormalities.  IMPRESSION: No findings to explain right lower quadrant pain. Negative appendix.   Electronically Signed   By: Tiburcio PeaJonathan  Watts M.D.   On: 01/27/2014 18:41     EKG Interpretation None      MDM   Final diagnoses:  RLQ abdominal pain  RLQ abdominal pain    Patient presents to emergency department for evaluation of right lower abdominal pain. Negative pregnancy, UA not infected earlier today. CT shows no acute abnormalities. Normal appendix. Patient feels significantly improved throughout stay of emergency department. Patient will follow up with PCP. Discussed reason to return to ED immediately. Vital signs stable for discharge. Patient refused rx for home pain meds. Patient / Family / Caregiver informed of clinical course, understand medical decision-making process, and agree with plan.     Mora BellmanHannah S Roshini Fulwider, PA-C 01/28/14 0013  Vida RollerBrian D Miller, MD 01/28/14 [phone]

## 2014-01-27 NOTE — ED Notes (Signed)
[facility].  C/o intermittent RLQ pain x 1 day and denies n/v/d.  [facility].  Pregnancy test was negative.

## 2014-01-27 NOTE — Telephone Encounter (Signed)
Spoke with patient. C/o lower pelvic pain x 1 week, which she describes as a "dull ache" below the umbilicus. Patient states that pain is worse today than it has been and now radiates to lower right quadrant. States every 2-3 hours she has stabbing and sharp pain intermittently. No fevers, no dysuria. Taking Microgestin as ordered. Expects cycle next week.   Offered office visit today with Lauro FranklinPatricia Rolen-Grubb, FNP and patient agreeable. Coming today at 1415. Lauro FranklinPatricia Rolen-Grubb, FNP notified.  Routing to provider for final review. Patient agreeable to disposition. Will close encounter

## 2014-01-27 NOTE — Telephone Encounter (Signed)
Pt is having lower pelvic pain that started last week. Pt had some spotting over the weekend. Pt would like a call back.

## 2014-01-30 NOTE — Progress Notes (Signed)
Encounter reviewed by Dr. Brook Silva.  

## 2014-07-04 ENCOUNTER — Telehealth: Payer: Self-pay | Admitting: Nurse Practitioner

## 2014-07-04 NOTE — Telephone Encounter (Signed)
LMTCB about canceled apointment

## 2014-10-30 ENCOUNTER — Ambulatory Visit: Payer: Managed Care, Other (non HMO) | Admitting: Nurse Practitioner

## 2014-11-04 ENCOUNTER — Ambulatory Visit (INDEPENDENT_AMBULATORY_CARE_PROVIDER_SITE_OTHER): Payer: BLUE CROSS/BLUE SHIELD | Admitting: Nurse Practitioner

## 2014-11-04 ENCOUNTER — Encounter: Payer: Self-pay | Admitting: Nurse Practitioner

## 2014-11-04 VITALS — BP 112/70 | HR 88 | Resp 16 | Ht 64.25 in | Wt 223.0 lb

## 2014-11-04 DIAGNOSIS — E282 Polycystic ovarian syndrome: Secondary | ICD-10-CM | POA: Diagnosis not present

## 2014-11-04 DIAGNOSIS — Z0000 Encounter for general adult medical examination without abnormal findings: Secondary | ICD-10-CM

## 2014-11-04 DIAGNOSIS — Z01419 Encounter for gynecological examination (general) (routine) without abnormal findings: Secondary | ICD-10-CM

## 2014-11-04 LAB — POCT URINALYSIS DIPSTICK
Bilirubin, UA: NEGATIVE
GLUCOSE UA: NEGATIVE
Ketones, UA: NEGATIVE
Leukocytes, UA: NEGATIVE
NITRITE UA: NEGATIVE
PROTEIN UA: NEGATIVE
UROBILINOGEN UA: NEGATIVE
pH, UA: 5

## 2014-11-04 MED ORDER — MICROGESTIN 1/20 1-20 MG-MCG PO TABS: ORAL_TABLET | ORAL | Status: DC

## 2014-11-04 NOTE — Progress Notes (Signed)
Encounter reviewed by Dr. Brook Amundson C. Silva.  

## 2014-11-04 NOTE — Progress Notes (Signed)
34 y.o. G0P0 Married Caucasian Fe here for annual exam.  Menses now 5 days.  1 day moderate the light for rest of days. Still going to school and not working. May want to start for a pregnancy.  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  Patient's last menstrual period was 10/31/2014.          Sexually active: Yes.    The current method of family planning is OCP (estrogen/progesterone).    Exercising: No.  The patient does not participate in regular exercise at present. Smoker:  no  Health Maintenance: Pap:  10/2011 Neg. HR HPV:neg TDaP:  2011 Labs: Here today Hg: 14.2  UA: RBC=Mod ( On Menses)   reports that she has never smoked. She has never used smokeless tobacco. She reports that she drinks about 2.5 oz of alcohol per week. She reports that she does not use illicit drugs.  Past Medical History  Diagnosis Date  . Migraine headache without aura 06/2010  . PCOS (polycystic ovarian syndrome)     suspected    Past Surgical History  Procedure Laterality Date  . Wisdom tooth extraction  1995    Current Outpatient Prescriptions  Medication Sig Dispense Refill  . ibuprofen (ADVIL,MOTRIN) 200 MG tablet Take 800 mg by mouth every 6 (six) hours as needed for moderate pain (back pain).    Marland Kitchen MICROGESTIN 1-20 MG-MCG tablet Take continous active pills.  3 months = 4 pack 4 Package 3   No current facility-administered medications for this visit.    Family History  Problem Relation Age of Onset  . Bladder Cancer Father 23    bladder cancer  . Heart failure Maternal Grandmother   . Diabetes Maternal Grandmother   . Heart failure Paternal Grandmother   . Hyperlipidemia Paternal Grandmother   . Hypertension Paternal Grandmother   . Lung cancer Paternal Grandfather     smoker  . Prostate cancer Paternal Grandfather     ROS:  Pertinent items are noted in HPI.  Otherwise, a comprehensive ROS was negative.  Exam:   BP 112/70 mmHg  Pulse 88  Resp 16  Ht 5' 4.25" (1.632 m)  Wt  223 lb (101.152 kg)  BMI 37.98 kg/m2  LMP 10/31/2014 Height: 5' 4.25" (163.2 cm) Ht Readings from Last 3 Encounters:  11/04/14 5' 4.25" (1.632 m)  01/27/14  (1.626 m)  10/28/13  (1.626 m)    General appearance: alert, cooperative and appears stated age Head: Normocephalic, without obvious abnormality, atraumatic Neck: no adenopathy, supple, symmetrical, trachea midline and thyroid normal to inspection and palpation Lungs: clear to auscultation bilaterally Breasts: normal appearance, no masses or tenderness Heart: regular rate and rhythm Abdomen: soft, non-tender; no masses,  no organomegaly Extremities: extremities normal, atraumatic, no cyanosis or edema Skin: Skin color, texture, turgor normal. No rashes or lesions Lymph nodes: Cervical, supraclavicular, and axillary nodes normal. No abnormal inguinal nodes palpated Neurologic: Grossly normal   Pelvic: External genitalia:  no lesions              Urethra:  normal appearing urethra with no masses, tenderness or lesions              Bartholin's and Skene's: normal                 Vagina: normal appearing vagina with normal color and light vaginal bleeding, no lesions              Cervix: anteverted  Pap taken: Yes.   Bimanual Exam:  Uterus:  normal size, contour, position, consistency, mobility, non-tender              Adnexa: no mass, fullness, tenderness               Rectovaginal: Confirms               Anus:  normal sphincter tone, no lesions  Chaperone present: Yes  A:  Well Woman with normal exam  OCP for contraception History of PCOS  May consider pregnancy by next spring - reviewed that she may need medication to help her get pregnant with history of PCOS.   P:   Reviewed health and wellness pertinent to exam  Pap smear as above  Refill OCP for a year  Counseled on breast self exam, use and side effects of OCP's, adequate intake of calcium and vitamin D, diet and  exercise.  Reviewed needs to increase exercise and reduce weight prior to pregnancy. return annually or prn  An After Visit Summary was printed and given to the patient.

## 2014-11-04 NOTE — Patient Instructions (Signed)

## 2014-11-05 LAB — HEMOGLOBIN, FINGERSTICK: Hemoglobin, fingerstick: 14.2 g/dL (ref 12.0–16.0)

## 2014-11-06 LAB — IPS PAP TEST WITH HPV

## 2015-04-24 ENCOUNTER — Encounter: Payer: Self-pay | Admitting: Nurse Practitioner

## 2015-04-24 ENCOUNTER — Ambulatory Visit (INDEPENDENT_AMBULATORY_CARE_PROVIDER_SITE_OTHER): Payer: BLUE CROSS/BLUE SHIELD | Admitting: Nurse Practitioner

## 2015-04-24 VITALS — BP 130/74 | HR 66 | Resp 18 | Wt 208.0 lb

## 2015-04-24 DIAGNOSIS — Z3189 Encounter for other procreative management: Secondary | ICD-10-CM

## 2015-04-24 NOTE — Progress Notes (Signed)
35 y.o.Married Caucasian  Female here for preconceptual counseling. Husband Bernette Redbird is with her today.  They go on several mission trips and has a trip planned this spring.  After return plans to start a family.  Gynecological History:    Menarche:age 61 LMP: 04/17/15 Length of cycle: regular on OCP  But she has history of PCOS and if off OCP cycles can be up to 4 months apart.  She skipped taking OCP in November. The cycle for December was a week or so later that usual and lasted longer.  Length of Menses: normally 4 days GYN infectious disease history:  (Abnormal pap, venereal warts, herpes, or other STD's )No Current Birth Control method:OCP = Microgestin  Last time birth control was used: currently  PMH:  Any history of DM, HTN, epilepsy, Heart Murmur, or thyroid problems? No   If so, when did it begin? Are you or have you ever been anemic?Yes some question of anemia in 2015 - but CBC was normal and no treatment was needed.  If so for how long? Have you ever had any accidents? No What type? Do you have any allergies? No Do you take any sedatives or tranquilizers? No Any domestic violence? No Any medications? Yes OCP only   (such as medications's for acne - certain medications can cause birth defects and Ace inhibitors can cause kidney problems in the fetus.)  Patient's Past Medical History:  Have you ever had surgery related to female organs? No Past pregnancies/ complications/ or miscarriages/ abortions? No ETOH? Yes Social at 4-5 beer or wine per week Tobacco Use?No Drug use? No  Reviewed Medication list: Yes Current job exposure risk - toxins/ Lead/ Mercury No Hot tub/ sauna use? No Do you commonly run long distance or do strenuous exercise? No Do you eat a strict vegetarian diet? No  Partners Past Medical History:  Have you ever had surgery related to female organs? No Previous Paternity? No Testicular Injury? 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 No Do you commonly run long distance or do strenuous exercise? No   Patient's Family Medical history: No Partners Family Medical History: No  Ethnic background? Mediterranean/ Asian/Chinese/ Ashkenazi Jews / [place]   Patients FMH:      Partners FMH: Multiple Births No   Multiple Births No Genetic Disorders No   Genetic Disorders No  Sickle cell      Sickle Cell  Hemophilia      Hemophilia  Cystic Fibrosis     Cystic Fibrosis  Mental retardation     Mental retardation  Downs Syndrome     Downs Syndrome  Immunization Updates: Rubella Vaccine/ titer Yes but unsure - will do a titer today Chicken Pox / vaccination Yes Toxoplasmosis exposure (no changing litter box) No Tdap for pt. and partner Yes Hepatitis B (if at high risk) Yes Influenza vaccine who may get pregnant during the flu season Yes   Recommendations:   No ETOH / Tobacco / Drugs  Limit Caffeine  No Artificial Sweeteners  No raw beef  Restrict High fat foods  Limit servings of large fish (e.g., swordfish) to 1 X month  Foods associated with Listeria transmission ( e.g., sliced delicatessen meats &  Cheese)  No hot /  tub Saunas  OTC med list  Counseling:   Normal pregnancy rates 80 % within 1 year  Rx. Prenatal Multivitamins - she will get and start taking  Discussion of timing of intercourse to ovulation  Since she has PCOS - would want her off OCP for only 2 months and then try - if no pregnancy after trying for 6 months - will need a consult apt to discuss other options of Clomid, etc to help her ovulate to get pregnant.  Labs: rubella and CBC  Time spent with patient / husband: 25 minutes

## 2015-04-24 NOTE — Patient Instructions (Signed)
Preparing for Pregnancy Before trying to become pregnant, make an appointment with your health care provider (preconception care). The goal is to help you have a healthy, safe pregnancy. At your first appointment, your health care provider will:   Do a complete physical exam, including a Pap test.  Take a complete medical history.  Give you advice and help you resolve any problems. PRECONCEPTION CHECKLIST Here is a list of the basics to cover with your health care provider at your preconception visit:  Medical history.  Tell your health care provider about any diseases you have had. Many diseases can affect your pregnancy.  Include your partner's medical history and family history.  Make sure you have been tested for sexually transmitted infections (STIs). These can affect your pregnancy. In some cases, they can be passed to your baby. Tell your health care provider about any history of STIs.  Make sure your health care provider knows about any previous problems you have had with conception or pregnancy.  Tell your health care provider about any medicine you take. This includes herbal supplements and over-the-counter medicines.  Make sure all your immunizations are up to date. You may need to make additional appointments.  Ask your health care provider if you need any vaccinations or if there are any you should avoid.  Diet.  It is especially important to eat a healthy, balanced diet with the right nutrients when you are pregnant.  Ask your health care provider to help you get to a healthy weight before pregnancy.  If you are overweight, you are at higher risk for certain complications. These include high blood pressure, diabetes, and preterm birth.  If you are underweight, you are more likely to have a low-birth-weight baby.  Lifestyle.  Tell your health care provider about lifestyle factors such as alcohol use, drug use, or smoking.  Describe any harmful substances you may  be exposed to at work or home. These can include chemicals, pesticides, and radiation.  Mental health.  Let your health care provider know if you have been feeling depressed or anxious.  Let your health care provider know if you have a history of substance abuse.  Let your health care provider know if you do not feel safe at home. HOME INSTRUCTIONS TO PREPARE FOR PREGNANCY Follow your health care provider's advice and instructions.   Keep an accurate record of your menstrual periods. This makes it easier for your health care provider to determine your baby's due date.  Begin taking prenatal vitamins and folic acid supplements daily. Take them as directed by your health care provider.  Eat a balanced diet. Get help from a nutrition counselor if you have questions or need help.  Get regular exercise. Try to be active for at least 30 minutes a day most days of the week.  Quit smoking, if you smoke.  Do not drink alcohol.  Do not take illegal drugs.  Get medical problems, such as diabetes or high blood pressure, under control.  If you have diabetes, make sure you do the following:  Have good blood sugar control. If you have type 1 diabetes, use multiple daily doses of insulin. Do not use split-dose or premixed insulin.  Have an eye exam by a qualified eye care professional trained in caring for people with diabetes.  Get evaluated by your health care provider for cardiovascular disease.  Get to a healthy weight. If you are overweight or obese, reduce your weight with the help of a qualified health   professional such as a registered dietitian. Ask your health care provider what the right weight range is for you. HOW DO I KNOW I AM PREGNANT? You may be pregnant if you have been sexually active and you miss your period. Symptoms of early pregnancy include:   Mild cramping.  Very light vaginal bleeding (spotting).  Feeling unusually tired.  Morning sickness. If you have any of  these symptoms, take a home pregnancy test. These tests look for a hormone called human chorionic gonadotropin (hCG) in your urine. Your body begins to make this hormone during early pregnancy. These tests are very accurate. Wait until at least the first day you miss your period to take one. If you get a positive result, call your health care provider to make appointments for prenatal care. WHAT SHOULD I DO IF I BECOME PREGNANT?  Make an appointment with your health care provider by week 12 of your pregnancy at the latest.  Do not smoke. Smoking can be harmful to your baby.  Do not drink alcoholic beverages. Alcohol is related to a number of birth defects.  Avoid toxic odors and chemicals.  You may continue to have sexual intercourse if it does not cause pain or other problems, such as vaginal bleeding.   This information is not intended to replace advice given to you by your health care provider. Make sure you discuss any questions you have with your health care provider.   Document Released: 02/18/2008 Document Revised: 03/28/2014 Document Reviewed: 02/11/2013 Elsevier Interactive Patient Education 2016 [employer] Inc[employer]  

## 2015-04-25 LAB — CBC WITH DIFFERENTIAL/PLATELET
Basophils Absolute: 0 10*3/uL (ref 0.0–0.1)
Basophils Relative: 0 % (ref 0–1)
EOS ABS: 0.1 10*3/uL (ref 0.0–0.7)
EOS PCT: 1 % (ref 0–5)
HEMATOCRIT: 43 % (ref 36.0–46.0)
Hemoglobin: 14 g/dL (ref 12.0–15.0)
LYMPHS PCT: 44 % (ref 12–46)
Lymphs Abs: 3.3 10*3/uL (ref 0.7–4.0)
MCH: 30.2 pg (ref 26.0–34.0)
MCHC: 32.6 g/dL (ref 30.0–36.0)
MCV: 92.7 fL (ref 78.0–100.0)
MONOS PCT: 5 % (ref 3–12)
MPV: 9.7 fL (ref 8.6–12.4)
Monocytes Absolute: 0.4 10*3/uL (ref 0.1–1.0)
Neutro Abs: 3.7 10*3/uL (ref 1.7–7.7)
Neutrophils Relative %: 50 % (ref 43–77)
Platelets: 366 10*3/uL (ref 150–400)
RBC: 4.64 MIL/uL (ref 3.87–5.11)
RDW: 13.1 % (ref 11.5–15.5)
WBC: 7.4 10*3/uL (ref 4.0–10.5)

## 2015-04-27 ENCOUNTER — Encounter: Payer: Self-pay | Admitting: Nurse Practitioner

## 2015-04-27 LAB — HEMOGLOBIN, FINGERSTICK: Hemoglobin, fingerstick: 14.1 g/dL (ref 12.0–16.0)

## 2015-04-28 LAB — RUBELLA ANTIBODY, IGM: Rubella IgM: 0.39 (ref <0.91)

## 2015-04-29 NOTE — Progress Notes (Signed)
Encounter reviewed by Dr. Brook Amundson C. Silva.  

## 2015-10-22 ENCOUNTER — Ambulatory Visit (INDEPENDENT_AMBULATORY_CARE_PROVIDER_SITE_OTHER): Payer: BLUE CROSS/BLUE SHIELD | Admitting: Certified Nurse Midwife

## 2015-10-22 ENCOUNTER — Telehealth: Payer: Self-pay | Admitting: Nurse Practitioner

## 2015-10-22 ENCOUNTER — Encounter: Payer: Self-pay | Admitting: Certified Nurse Midwife

## 2015-10-22 VITALS — BP 108/70 | HR 72 | Resp 16 | Ht 64.25 in | Wt 211.0 lb

## 2015-10-22 DIAGNOSIS — Z8742 Personal history of other diseases of the female genital tract: Secondary | ICD-10-CM | POA: Diagnosis not present

## 2015-10-22 DIAGNOSIS — N912 Amenorrhea, unspecified: Secondary | ICD-10-CM | POA: Diagnosis not present

## 2015-10-22 DIAGNOSIS — Z3202 Encounter for pregnancy test, result negative: Secondary | ICD-10-CM | POA: Diagnosis not present

## 2015-10-22 LAB — POCT URINE PREGNANCY: PREG TEST UR: NEGATIVE

## 2015-10-22 NOTE — Telephone Encounter (Signed)
Returned call to patient. Patient states she was seen in February by Alexia Freestone for family planning. Patient has a history of PCOS so has been "on pills my whole life." Patient stopped pills in April to begin trying to conceive. Has had two normal periods since being off OCPs. One 36 days in length the other 33 days in length. Patient states she and her husband started trying mid July, per Patty, for pregnancy. Patient states she is now day 35 cycle and had one occurrence of spotting on Sunday 07/30, but when she changed her tampon there was no blood and she has not had any spotting or bleeding since. Took a UPT on 07/29 and 08/01 that were both negative.   Patient aware Alexia Freestone is out of the office today and RN offered visit with another provider that patient first declined, and then stated, "for my nerves let me just go ahead and be seen today." Office visit scheduled with Ortencia Kick, CNM for 08/03 at 1100 per patient request. Patient agreeable to date and time.   Routing to provider for review. Will close encounter.   Cc Ria Comment, FNP

## 2015-10-22 NOTE — Telephone Encounter (Signed)
Patient wants to speak with Patty's nurse. No information given.

## 2015-10-22 NOTE — Progress Notes (Signed)
35 y.o.marital status white female g0p0 presents with amenorrhea with negative UPT on 10/21/15 and negative UPT here today. LMP 09/18/15 ( 33 day cycle) Previous cycle 32 days. Now at day 35 of cycle and had one day of bleeding was 10/18/15 and was red, and only lasted one day, felt like period during and before. Patient has history of PCOS and has had irregular cycles all her life and was on OCP until the last 3 months, so not sure what to expect with periods. Not taking prenatal vitamins, but trying for pregnancy. No nausea or breast tenderness, or cramping. No change in vaginal discharge or itching.  No other health issues today.   POCT UPT negative  O: HPI pertinent to above. Healthy WDWN female Affect: normal, orientation x 3  Pelvic exam: External genital: normal female QPY:[id number] Bladder and urethral meatus: non tender Vagina: normal appearance with scant blood noted Uterus: small, non tender, no masses Cervix: non tender, no lesions, scant blood noted from cervix Adnexa: normal, no masses, tenderness or fullness    A:Normal pelvic exam  History of PCOS with irregular long cycles  Trying for pregnancy Negative home UPT and negative UPT here today  P:Discussed with patient normal pelvic exam and scant blood present, so with negative pregnancy test she can count this as a period. Discussed due to long use of OCP and PCOS history she may have long cycles as her body is trying to establish her normal with OCP use. Discussed ovulation and best time to try for pregnancy. Stressed need to start on Prenatal vitamins and folic acid content. Discussed ovulation kit for help with predicting ovulation with long cycles or BBT graph. Questions addressed. Patient will repeat pregnancy test in 4-5 days for reassurance and will advise if scant bleeding continues or concerns. Should call if no pregnancy by 6 months due to [facility].   Rv prn

## 2015-10-23 NOTE — Progress Notes (Signed)
Encounter reviewed Diamond Birkey, MD   

## 2015-11-06 ENCOUNTER — Encounter: Payer: Self-pay | Admitting: Nurse Practitioner

## 2015-11-06 ENCOUNTER — Ambulatory Visit (INDEPENDENT_AMBULATORY_CARE_PROVIDER_SITE_OTHER): Payer: BLUE CROSS/BLUE SHIELD | Admitting: Nurse Practitioner

## 2015-11-06 VITALS — BP 108/64 | HR 72 | Ht 64.0 in | Wt 213.0 lb

## 2015-11-06 DIAGNOSIS — Z01419 Encounter for gynecological examination (general) (routine) without abnormal findings: Secondary | ICD-10-CM

## 2015-11-06 DIAGNOSIS — Z0000 Encounter for general adult medical examination without abnormal findings: Secondary | ICD-10-CM | POA: Diagnosis not present

## 2015-11-06 LAB — POCT URINALYSIS DIPSTICK
Bilirubin, UA: NEGATIVE
Blood, UA: NEGATIVE
GLUCOSE UA: NEGATIVE
Ketones, UA: NEGATIVE
LEUKOCYTES UA: NEGATIVE
Nitrite, UA: NEGATIVE
PROTEIN UA: NEGATIVE
Urobilinogen, UA: NEGATIVE
pH, UA: 7

## 2015-11-06 NOTE — Patient Instructions (Signed)

## 2015-11-06 NOTE — Progress Notes (Signed)
At 3436, I think she would benefit from MD consultation at this point as to not waste time.  Ok to schedule if pt in agreement.  Reviewed personally.  Lum KeasM. Suzanne Gloris Shiroma, MD.

## 2015-11-06 NOTE — Progress Notes (Signed)
Patient ID: Diamond Hunter, female   DOB: October 26, 1980, 35 y.o.   MRN: [id number]  35 y.o. G0P0000 Married  Caucasian Fe here for annual exam.  Had last menses the day after she was here with negative UPT.  Cycles are normally now 33-36 days.  The last one was 6 days, heavy to moderate.  Has only been trying for pregnancy X 2 month.  She is aware if no pregnancy in 6 months to call - with her history of PCOS she may need some help.  Patient's last menstrual period was 10/23/2015 (exact date).          Sexually active: Yes.    The current method of family planning is none.    Exercising: No.  The patient does not participate in regular exercise at present. Smoker:  no  Health Maintenance: Pap: 11/04/14, Negative with negative HR HPV TDaP:  2011 HIV: 2005 Labs: HB: 13.7  Urine: Negative    reports that she has never smoked. She has never used smokeless tobacco. She reports that she drinks about 0.6 oz of alcohol per week . She reports that she does not use drugs.  Past Medical History:  Diagnosis Date  . Migraine headache without aura 06/2010  . PCOS (polycystic ovarian syndrome)    suspected    Past Surgical History:  Procedure Laterality Date  . WISDOM TOOTH EXTRACTION  1995    No current outpatient prescriptions on file.   No current facility-administered medications for this visit.     Family History  Problem Relation Age of Onset  . Bladder Cancer Father 7155    bladder cancer  . Heart failure Maternal Grandmother   . Diabetes Maternal Grandmother   . Heart failure Paternal Grandmother   . Hyperlipidemia Paternal Grandmother   . Hypertension Paternal Grandmother   . Lung cancer Paternal Grandfather     smoker  . Prostate cancer Paternal Grandfather     ROS:  Pertinent items are noted in HPI.  Otherwise, a comprehensive ROS was negative.  Exam:   BP 108/64 (BP Location: Right Arm, Patient Position: Sitting, Cuff Size: Large)   Pulse 72   Ht 5\' 4"  (1.626 m)   Wt 213  lb (96.6 kg)   LMP 10/23/2015 (Exact Date)   BMI 36.56 kg/m  Height: 5\' 4"  (162.6 cm) Ht Readings from Last 3 Encounters:  11/06/15 5\' 4"  (1.626 m)  10/22/15 5' 4.25" (1.632 m)  11/04/14 5' 4.25" (1.632 m)    General appearance: alert, cooperative and appears stated age Head: Normocephalic, without obvious abnormality, atraumatic Neck: no adenopathy, supple, symmetrical, trachea midline and thyroid normal to inspection and palpation Lungs: clear to auscultation bilaterally Breasts: normal appearance, no masses or tenderness Heart: regular rate and rhythm Abdomen: soft, non-tender; no masses,  no organomegaly Extremities: extremities normal, atraumatic, no cyanosis or edema Skin: Skin color, texture, turgor normal. No rashes or lesions Lymph nodes: Cervical, supraclavicular, and axillary nodes normal. No abnormal inguinal nodes palpated Neurologic: Grossly normal   Pelvic: External genitalia:  no lesions              Urethra:  normal appearing urethra with no masses, tenderness or lesions              Bartholin's and Skene's: normal                 Vagina: normal appearing vagina with normal color and discharge, no lesions  Cervix: anteverted              Pap taken: No. Bimanual Exam:  Uterus:  normal size, contour, position, consistency, mobility, non-tender              Adnexa: no mass, fullness, tenderness               Rectovaginal: Confirms               Anus:  normal sphincter tone, no lesions  Chaperone present: yes  A:  Well Woman with normal exam  No contraception History of PCOS             Trying for pregnancy  - reviewed that she may need medication to help her get pregnant with history of PCOS.    P:   Reviewed health and wellness pertinent to exam  Pap smear not done  Continue with menstrual app.  Counseled on breast self exam, adequate intake of calcium and vitamin D, diet and exercise return annually or prn  An After  Visit Summary was printed and given to the patient.

## 2015-11-09 LAB — HEMOGLOBIN, FINGERSTICK: Hemoglobin, fingerstick: 13.7 g/dL (ref 12.0–16.0)

## 2015-11-09 NOTE — Progress Notes (Signed)
At [facility] patient and I  had discussed waiting for 6 months after starting to conceive and if not pregnant to then come in for consultation.  Per Dr.Miller - offer pt to come in and discuss fertility issues sooner rather than later given her age of 89.

## 2015-11-10 ENCOUNTER — Telehealth: Payer: Self-pay | Admitting: *Deleted

## 2015-11-10 NOTE — Telephone Encounter (Signed)
Call to patient. Advised that after supervising MD reviewed annual exam with Diamond GoltzPatty Hunter from 11-06-15 visit, wanted to offer office visit with MD to review status and potentially facilitate pregnancy. Patient states she saw Diamond in February and has had regular menses at 31-36 days since being off OCP's. She and husband wanted to try for 6 months before deciding on further evaluation. Patient will discuss with husband and call back if decides she is ready to schedule.  Routing to provider for final review. Patient agreeable to disposition. Will close encounter.    CC: Dr Hyacinth MeekerMiller

## 2015-11-11 DIAGNOSIS — H5213 Myopia, bilateral: Secondary | ICD-10-CM | POA: Diagnosis not present

## 2016-02-03 ENCOUNTER — Telehealth: Payer: Self-pay | Admitting: Nurse Practitioner

## 2016-02-03 NOTE — Telephone Encounter (Signed)
Patient said she would like an appointment to get evaluated to find out if she can get pregnant.

## 2016-02-03 NOTE — Telephone Encounter (Signed)
Spoke with patient. Patient states that she has been trying for pregnancy for 4-5 months. Used an ovulation predictor kit last month and states she did not have any positive results. Patient started her LMP on 01/28/2016. Patient would like to come in to consult with Dr.Miller regarding fertility. Appointment scheduled for 02/15/2016 at 2 pm with Dr.Miller. Patient is agreeable to date and time.  Routing to provider for final review. Patient agreeable to disposition. Will close encounter.

## 2016-02-15 ENCOUNTER — Ambulatory Visit (INDEPENDENT_AMBULATORY_CARE_PROVIDER_SITE_OTHER): Payer: BLUE CROSS/BLUE SHIELD | Admitting: Obstetrics & Gynecology

## 2016-02-15 ENCOUNTER — Encounter: Payer: Self-pay | Admitting: Obstetrics & Gynecology

## 2016-02-15 VITALS — BP 112/58 | HR 72 | Resp 16 | Ht 64.0 in | Wt 216.2 lb

## 2016-02-15 DIAGNOSIS — N970 Female infertility associated with anovulation: Secondary | ICD-10-CM

## 2016-02-15 DIAGNOSIS — E282 Polycystic ovarian syndrome: Secondary | ICD-10-CM

## 2016-02-15 DIAGNOSIS — N938 Other specified abnormal uterine and vaginal bleeding: Secondary | ICD-10-CM | POA: Diagnosis not present

## 2016-02-15 NOTE — Patient Instructions (Signed)
1.  Day 3 FSH testing.  Pt will call with onset of cycle.    2.  Will testing fasting insulin level same day as [place].  Six hour fast.  Water and coffee is ok but no sugars or fat.    3.  Semen analysis kit and order given.  4.    Pt understands she may need to use Clomid or Femara as desired.  Would most likely start Femara 7.'5mg'$  days 5-9 of cycle.  Would test day 23 progesterone level to prove ovulation.  5. If starts Clomid/femara, will need SHGM to evaluate for tubal patency.  Pt understands this is done timed with menstrual cycle days 1-9.  Would recommend doing this after 3 moths of ovulation induction.  6.  Start PNV.  Pt voices understanding.

## 2016-02-15 NOTE — Progress Notes (Signed)
17 yrs G0 MWF here for discussion of desires to be pregnant.  Married for 5 years.  Pt has been keeping tract of cycles.  Flow is between 30-38 days.  She has been on OCPs for cycle regulation for most of her adult life.  She does have a hx of PCOS but has not done any recent blood work or ultrasounds.  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  She already had an undergraduate degree.    She did stop her OCPs about three months ago.  Cycles are irregular, again.  She has done ovulation testing and has not had a positive test.  Husband has never fathered children.  She does not want to start any treatment yet but does want to know what next steps would be.  She will be finished with her degree in May but may want to do "something" before that time.   She has not done ovulation testing.  Is currently taking a PNV.  PMed hx:  PCOS, migraines without aura PSurg Hx: wisdom tooth extraction Smoker: No  Spouse PMed Hx: negative Spouse smokes: No  He has not fathered any children  D/w different causes of infertility including female factor, female factor, and undetermined causes.  Also, d/w pt "definition" of primary infertility.  Given PCOS hx and age, I do not feel she needs to wait a year before proceeding with evaluation and/or treatment options.  We discussed components of fertility evaluation including testing for ovulation, semen analysis testing, and evaluation of cavity of uterus and for tubal patency.  Speed of this evaluation and proceeding with fertility medication will depend on desires and pt and her spouse.  Currently, her school work may also influence the speed with which they proceed.  Also, due to age, feel should test for ovarian reserve as well.  All of this was written down for pt and she clearly understands the plan.  Questions invited and answered.  Specific instructions regarding each part of evaluation were given to patient.  Assessment:  PCOS Irregular menstrual  cycles probably due to oligo-ovulation Desires for conception  Plan:   1.  Day 3 FSH testing.  Pt will call with onset of cycle.    2.  Will testing fasting insulin level same day as [place].  Six hour fast.  Water and coffee is ok but no sugars or fat.  May need to consider Metformin for pt as well, depending on results of blood work.  (Orders placed for future labs)  3.  Semen analysis kit and order given.  Information for scheduling testing also provided.  4.    Pt understands she may need to use Clomid or Femarafor ovulation induction.  At this time, I would most likely start Femara 7.'5mg'$  days 5-9 of cycle.  Would test day 23 progesterone level to prove ovulation.  Increased twin and triplet rate was discussed with pt.  5. If starts Clomid/femara, will need SHGM to evaluate for tubal patency.  Pt understands this is done timed with menstrual cycle days 1-9.  Would recommend doing this after 3 moths of ovulation induction.  ~30 minutes spent with patient >50% of time was in face to face discussion of above.

## 2016-02-16 DIAGNOSIS — N970 Female infertility associated with anovulation: Secondary | ICD-10-CM | POA: Insufficient documentation

## 2016-02-16 DIAGNOSIS — E282 Polycystic ovarian syndrome: Secondary | ICD-10-CM | POA: Insufficient documentation

## 2016-02-16 DIAGNOSIS — N938 Other specified abnormal uterine and vaginal bleeding: Secondary | ICD-10-CM | POA: Insufficient documentation

## 2016-03-02 ENCOUNTER — Telehealth: Payer: Self-pay | Admitting: Obstetrics & Gynecology

## 2016-03-02 NOTE — Telephone Encounter (Signed)
Spoke with patient. Patient states she started cycle today, 12/13 and would like to schedule labs talked about at OV 11/27 with Dr. Hyacinth MeekerMiller. Patient scheduled for 12/15 at 0900 -advised of fasting lab directions as seen below per Dr. Hyacinth MeekerMiller. Patient verbalizes understanding and is agreeable to date and time.    Plan:   1.  Day 3 FSH testing.  Pt will call with onset of cycle.    2.  Will testing fasting insulin level same day as FSH.  Six hour fast.  Water and coffee is ok but no sugars or fat.  May need to consider Metformin for pt as well, depending on results of blood work.  (Orders placed for future labs)   Routing to provider for final review. Patient is agreeable to disposition. Will close encounter.   Cc: Dr. Hyacinth MeekerMiller

## 2016-03-02 NOTE — Telephone Encounter (Signed)
Patient called says she needs to schedule some test for day 3 of her cycle.  She started her cycle today.

## 2016-03-04 ENCOUNTER — Other Ambulatory Visit: Payer: BLUE CROSS/BLUE SHIELD

## 2016-03-04 DIAGNOSIS — E282 Polycystic ovarian syndrome: Secondary | ICD-10-CM

## 2016-03-04 DIAGNOSIS — N938 Other specified abnormal uterine and vaginal bleeding: Secondary | ICD-10-CM

## 2016-03-05 LAB — INSULIN, FASTING: Insulin fasting, serum: 3.4 u[IU]/mL (ref 2.0–19.6)

## 2016-03-05 LAB — FOLLICLE STIMULATING HORMONE: FSH: 5.7 m[IU]/mL

## 2016-03-25 ENCOUNTER — Telehealth: Payer: Self-pay | Admitting: *Deleted

## 2016-03-25 NOTE — Telephone Encounter (Signed)
Call to patient. Patient notified of semen analysis report. Patient verbalized understanding and very appreciative of phone call. Patient states she knows Dr. Hyacinth MeekerMiller wanted her to start on femara and glucophage once she desired to start trying for pregnancy. Patient states she is at that point and is ready for prescriptions. Pharmacy on file verified. RN advised this message would be sent to Dr. Hyacinth MeekerMiller for review and our office would call with any additional recommendations. Patient agreeable.    Routing to provider for review.

## 2016-03-28 ENCOUNTER — Other Ambulatory Visit: Payer: Self-pay | Admitting: Obstetrics & Gynecology

## 2016-03-28 MED ORDER — METFORMIN HCL 500 MG PO TABS: ORAL_TABLET | ORAL | 2 refills | Status: DC

## 2016-03-28 NOTE — Telephone Encounter (Signed)
Rx for glucophage 500mg  sent to pharmacy.  Pt should start with once daily and can increase to BID dosing after 14 days.  Most common side effect is GI distress--cramping and/or diarrhea so this is why we increase this slowly.  If she has issues with pain or diarrhea, she should call because there is a 250mg  dosing.  This is unusual at the 500mg  dosage, FYI.  With onset of menstrual cycle, will start Femara 7.5mg  days 5-9 of cycle.  She will need a progesterone level timed with the day she starts the Femara so that is why I want her to call with cycle onset.  Thanks.

## 2016-03-29 NOTE — Telephone Encounter (Signed)
Call to patient. Message given as seen below from Dr. Hyacinth MeekerMiller. Patient states her pharmacy had already texted her letting her know she had a prescription ready for pickup. Reviewed use of glucophage with patient and she verbalized understanding. Patient aware to call if she has any issues while taking the glucophage. Patient also aware to call with onset of her next menstrual cycle so she can have a timed progesterone level with start of femara.    Routing to provider for final review. Patient agreeable to disposition. Will close encounter.

## 2016-03-29 NOTE — Telephone Encounter (Signed)
Patient called and left a message on our voicemail.  She said, "My recent lab results indicated I need some new prescriptions but when I called the pharmacy, they did not have anything for me."

## 2016-04-18 ENCOUNTER — Telehealth: Payer: Self-pay | Admitting: Obstetrics & Gynecology

## 2016-04-18 MED ORDER — LETROZOLE 2.5 MG PO TABS: ORAL_TABLET | ORAL | 0 refills | Status: DC

## 2016-04-18 NOTE — Telephone Encounter (Signed)
Spoke with patient. Patient states that her menses started on 04/17/2016. Patient is ready to start Femara (see telephone note from 03/25/2016). Advised will need to start Femara on 04/21/2016-04/25/2016. Lab appointment for day 23 progesterone level scheduled for 05/09/2016 at 1:30 pm. Patient is agreeale to date and time. Rx fr Femara 2.5 mg take 3 tablets (7.5 mg) days 5-9 of cycle #15 0RF sent to pharmacy on file.  Routing to provider for final review. Patient agreeable to disposition. Will close encounter.

## 2016-04-18 NOTE — Telephone Encounter (Signed)
Patient called and left a message on our voice mail.  She said, "I need to schedule a lab to see what my progesterone baseline is for fertility.  I was told to call and schedule this during my cycle and I am on that now."

## 2016-04-28 ENCOUNTER — Encounter: Payer: Self-pay | Admitting: Obstetrics & Gynecology

## 2016-05-09 ENCOUNTER — Other Ambulatory Visit: Payer: Self-pay | Admitting: *Deleted

## 2016-05-09 ENCOUNTER — Other Ambulatory Visit (INDEPENDENT_AMBULATORY_CARE_PROVIDER_SITE_OTHER): Payer: BLUE CROSS/BLUE SHIELD

## 2016-05-09 DIAGNOSIS — N970 Female infertility associated with anovulation: Secondary | ICD-10-CM

## 2016-05-10 LAB — PROGESTERONE: Progesterone: 4.9 ng/mL

## 2016-05-11 ENCOUNTER — Telehealth: Payer: Self-pay | Admitting: Nurse Practitioner

## 2016-05-11 NOTE — Telephone Encounter (Signed)
Dr. Hyacinth MeekerMiller -can you review lab results dated 2/19 and advise?

## 2016-05-11 NOTE — Telephone Encounter (Signed)
Patient is asking if her lab results are back? I informed patient hat Dr.Miller is not in the office today and she may not receive a call back today.

## 2016-05-12 NOTE — Telephone Encounter (Signed)
Spoke with patient. Advised of message as seen below from Dr.Miller. Verified with patient LMP started on 04/17/2016. Patient took Femara 04/21/2016-04/25/2016. Progesterone level was drawn on 05/09/2016. Advised she took this correctly and lab was drawn on correct day. Will need to take Femara again with next cycle and have day 3 FSH level. Patient to call with start of menses to discuss start of Femara and to schedule lab appointment.  Routing to provider for final review. Patient agreeable to disposition. Will close encounter.

## 2016-05-12 NOTE — Telephone Encounter (Signed)
Just to clarify, I want to repeat the day 23 progesterone level, not day 3.  So, she should call with onset of her cycle for Femara 7.5mg  days 5-9 and to schedule day 23 progesterone level.

## 2016-05-12 NOTE — Telephone Encounter (Signed)
Day 23 did not show ovulation.  Can you confirm that she took the Femara this month and confirm the dates to make sure the lab was done the correct day.  If not done, she should start it with next cycle.  If was done on correct day, I want her to take Femara again with cycle and repeat the day 3 FSH.

## 2016-05-20 ENCOUNTER — Telehealth: Payer: Self-pay | Admitting: Nurse Practitioner

## 2016-05-20 DIAGNOSIS — N970 Female infertility associated with anovulation: Secondary | ICD-10-CM

## 2016-05-20 MED ORDER — LETROZOLE 2.5 MG PO TABS: ORAL_TABLET | ORAL | 0 refills | Status: DC

## 2016-05-20 NOTE — Telephone Encounter (Signed)
Call to patient related to lab appointment scheduled for 05-23-16. Reviewed lab tests ordered per Dr Hyacinth MeekerMiller. (See previous phone note for order)  Needs day 23 progesterone level repeated. Does not need day 3 FSH, canceled appointment for Monday. Instructed to take Femara 7.5 mg po day 5-9 of cycle and repeat progesterone level on day 23. Menses started today 05-20-16. Progesterone level will need to be done on Sat 06-11-16 so will mail RX and patient will have done at local [facility].  Patient to start Femara on on Tuesday 05-24-16 and take at same time each day (cycle day 5)  Support given, questions related to timing of intercourse answered. Address confirmed to mail Rx for blood test.   Routing to provider for final review. Patient agreeable to disposition. Will close encounter.    CC: Dr Edward JollySilva, covering for Dr Hyacinth MeekerMiller.

## 2016-05-20 NOTE — Telephone Encounter (Signed)
Patient scheduled her 3 day lab 05/23/16. Will need orders for labs.

## 2016-05-23 ENCOUNTER — Other Ambulatory Visit: Payer: BLUE CROSS/BLUE SHIELD

## 2016-05-23 ENCOUNTER — Other Ambulatory Visit: Payer: Self-pay | Admitting: *Deleted

## 2016-05-23 NOTE — Telephone Encounter (Signed)
Written order to Dr. Miller for signature. 

## 2016-05-24 ENCOUNTER — Telehealth: Payer: Self-pay | Admitting: Nurse Practitioner

## 2016-05-24 DIAGNOSIS — N3091 Cystitis, unspecified with hematuria: Secondary | ICD-10-CM | POA: Diagnosis not present

## 2016-05-24 NOTE — Telephone Encounter (Signed)
Patient returned call. Patient has schedule an appointment with her PCP today at 11:20 am. Will contact our office if she needs any further assistance or would like to be seen for further evaluation. Patient is agreeable.  Routing to provider for final review. Patient agreeable to disposition. Will close encounter.

## 2016-05-24 NOTE — Telephone Encounter (Signed)
Patient having dull ache in lower left pelvic area that started last night.  As of this morning she has blood in her urine.  She is not sure if she should be seen here of with [place]

## 2016-05-24 NOTE — Telephone Encounter (Signed)
Spoke with patient. Patient states that last night after dinner she began to have a dull pain in her left lower quadrant. Denies any sharp pain, fever, chills, or lower back pain. When patient urinates she feels she is not completely emptying her bladder. Patient noticed blood in her urine this morning. Asking if she needs to be seen her or with her PCP. Advised PCP and or office can evaluate her for these symptoms. Advised this up to her. Patient states her PCP is located closer to her home and wants to check with them to see their schedule and return call.

## 2016-05-27 NOTE — Telephone Encounter (Signed)
Has this been handled.  I want to make sure this was signed and sent to pt.  Sending message to both Billie RuddySally Yeakley and Carmelina DaneJill Hamm.

## 2016-05-27 NOTE — Telephone Encounter (Signed)
Written order out to mail on 05/23/16. Copy of written order to scan.  Routing to provider for final review. Patient is agreeable to disposition. Will close encounter.

## 2016-06-11 DIAGNOSIS — Z3189 Encounter for other procreative management: Secondary | ICD-10-CM | POA: Diagnosis not present

## 2016-06-15 ENCOUNTER — Telehealth: Payer: Self-pay | Admitting: Obstetrics & Gynecology

## 2016-06-15 NOTE — Telephone Encounter (Signed)
Patient checking the status of 23 progesterone labs she had done at [facility] on Saturday.

## 2016-06-15 NOTE — Telephone Encounter (Signed)
Spoke with patient, advised labs to be faxed from [facility]. Advised patient Dr. Hyacinth MeekerMiller is out of the office today, will return 3/29. Will return call with recommendations once reviewd. Patient thankful for return call and is agreeable.

## 2016-06-15 NOTE — Telephone Encounter (Signed)
Spoke with Gavin PoundDeborah in lab at [facility], will fax results to [phone], ATTN: Noreene LarssonJill.

## 2016-06-15 NOTE — Telephone Encounter (Signed)
Dr. Hyacinth MeekerMiller -labs placed on desk for review, please advise?

## 2016-06-16 MED ORDER — LETROZOLE 2.5 MG PO TABS: ORAL_TABLET | ORAL | 0 refills | Status: DC

## 2016-06-16 NOTE — Telephone Encounter (Signed)
Spoke with patient, advised of results and recommendations per Dr. Hyacinth MeekerMiller -progesterone lab showed ovulation, should call office if missed cycle or with positive UPT,UPT on day 30, repeat Femara days 5-9 of menses. Advised new order placed for Femara at pharmacy on file. Patient reports last cycle was different than previous, lighter and more cramping. Patient thankful for follow-up, verbalizes understanding and is agreeable.    Routing to provider for final review. Patient is agreeable to disposition. Will close encounter.

## 2016-07-21 ENCOUNTER — Telehealth: Payer: Self-pay | Admitting: Obstetrics & Gynecology

## 2016-07-21 NOTE — Telephone Encounter (Signed)
She needs a SHGM for oligomenorrhea and for assessment of tubal patency.  This can be done in the office but needs to be scheduled within 9 days of her cycle.

## 2016-07-21 NOTE — Telephone Encounter (Signed)
Spoke with patient. LMP 06/23/2016. Patient states that she took Femara on April 9-13. Took UPT today which was negative. Patient took Femara in February and March as well. Day 23 progesterone level on 2/19 should no ovulation. Day 23 progesterone level on 3/2 showed ovulation. Patient is asking what alternative options she has at this time for further testing or treatment. Advised will review with Dr.Miller and return call. Patient is agreeable.

## 2016-07-21 NOTE — Telephone Encounter (Signed)
Patient took a 30 day pregnancy test again and it was negative.  Wants to speak with a nurse about next step.

## 2016-07-22 NOTE — Telephone Encounter (Signed)
Left message to call Kaitlyn at [phone]. 

## 2016-07-22 NOTE — Telephone Encounter (Signed)
Spoke with patient. Advised of message as seen below from Dr.Miller. Patient is agreeable and verbalizes understanding. Will call with first day of her menses.  Routing to provider for final review. Patient agreeable to disposition. Will close encounter.

## 2016-07-27 ENCOUNTER — Telehealth: Payer: Self-pay | Admitting: Obstetrics & Gynecology

## 2016-07-27 NOTE — Telephone Encounter (Signed)
Spoke with patient. Patient states that her menses has not started this month and she is on cycle day 36. Took 2 UPT today and both have been positive. Advised patient she will need to be seen for pregnancy confirmation in the office with Dr.Miller. Patient is agreeable. Appointment scheduled for tomorrow 07/28/2016 at 2:30 pm. Patient is agreeable to date and time.  Routing to provider for final review. Patient agreeable to disposition. Will close encounter.

## 2016-07-27 NOTE — Telephone Encounter (Signed)
Patient called to advise she has had two positive pregnancy tests today.  Patient would like to know if a confirmation appointment will be needed and the time frame in which to schedule. Advised patient I forward her questions to our Triage Nurse. Patient is agreeable to a return call.  Routing to Triage Nurse

## 2016-07-28 ENCOUNTER — Encounter: Payer: Self-pay | Admitting: Obstetrics & Gynecology

## 2016-07-28 ENCOUNTER — Ambulatory Visit (INDEPENDENT_AMBULATORY_CARE_PROVIDER_SITE_OTHER): Payer: BLUE CROSS/BLUE SHIELD | Admitting: Obstetrics & Gynecology

## 2016-07-28 VITALS — BP 110/62 | HR 98 | Resp 16 | Ht 64.0 in | Wt 212.0 lb

## 2016-07-28 DIAGNOSIS — N912 Amenorrhea, unspecified: Secondary | ICD-10-CM | POA: Diagnosis not present

## 2016-07-28 LAB — POCT URINE PREGNANCY: PREG TEST UR: POSITIVE — AB

## 2016-07-28 MED ORDER — CITRANATAL ASSURE 35-1 & 300 MG PO MISC: 1.0000 | Freq: Every day | ORAL | 12 refills | Status: DC

## 2016-07-28 NOTE — Progress Notes (Signed)
GYNECOLOGY  VISIT   HPI: 36 y.o. 381P0000 Married Caucasian female here for confirmation of pregnancy. Pt has used Femara for about three months. Has done two UPTs at home.  Is having breast tenderness.  LMP 06/22/16.  EGA: 5 0/7.  EDC: 03/29/17.  Denies nausea.  No vaginal bleeding or uterine cramping.  However, she's had some GI distress with certain vegetables just in the last week.  UPT here is positive as well.  Pt is very nervous.  Reviewed testing HCGs and following.  Pt does not know blood type.  Would like to have this tested.  No cats in the home.  Knows not to change kitty litter.   Tdap 1/11.  Had chicken pox as a child.  Aware flu vaccine safe and recommended.  Fish/shellfish/mercury discuss.  Patient likes fish and sushi.  Not consuming unpasteurized cheese/juices discussed.  Nitrites in foods disucssed.  Exercise and intercourse discussed.  Pt has additional questions that were reviewed as well.  GYNECOLOGIC HISTORY: Patient's last menstrual period was 06/22/2016.  Patient Active Problem List   Diagnosis Date Noted  . DUB (dysfunctional uterine bleeding) 02/16/2016  . PCOS (polycystic ovarian syndrome) 02/16/2016  . Oligo-ovulation 02/16/2016  . PATELLO-FEMORAL SYNDROME 01/11/2008  . METATARSALGIA 01/11/2008  . UNEQUAL LEG LENGTH 01/11/2008    Past Medical History:  Diagnosis Date  . Migraine headache without aura 06/2010  . PCOS (polycystic ovarian syndrome)    suspected    Past Surgical History:  Procedure Laterality Date  . WISDOM TOOTH EXTRACTION  1995    MEDS:  Reviewed in EPIC and UTD  ALLERGIES: Patient has no known allergies.  Family History  Problem Relation Age of Onset  . Bladder Cancer Father [id number]       bladder cancer  . Heart failure Maternal Grandmother   . Diabetes Maternal Grandmother   . Heart failure Paternal Grandmother   . Hyperlipidemia Paternal Grandmother   . Hypertension Paternal Grandmother   . Lung cancer Paternal Grandfather    smoker  . Prostate cancer Paternal Grandfather     SH:  Married, non smoker  Review of Systems  All other systems reviewed and are negative.   PHYSICAL EXAMINATION:    BP 110/62 (BP Location: Right Arm, Patient Position: Sitting, Cuff Size: Large)   Pulse 98   Resp 16   Ht 5\' 4"  (1.626 m)   Wt 212 lb (96.2 kg)   LMP 06/22/2016   BMI 36.39 kg/m     Physical Exam  Constitutional: She is oriented to person, place, and time. She appears well-developed and well-nourished.  Neurological: She is alert and oriented to person, place, and time.  Skin: Skin is warm.  Psychiatric: She has a normal mood and affect.   Chaperone was present for exam.  Assessment: Amenorrhea with positive UPT  Anxiety about miscarriage  Plan: HCG and ABO will be obtained today.  Plan repeat HCG next week depending on result. Will plan viability scan around 7 weeks.  Switch PNV to Citrinatal Assure as pt has concerns about constipation.   Lengthy visit with pt due to questions and concerns.  About 30 minutes spent with patient >50% of time was in face to face discussion of above.

## 2016-07-29 ENCOUNTER — Encounter: Payer: Self-pay | Admitting: Obstetrics & Gynecology

## 2016-07-29 ENCOUNTER — Telehealth: Payer: Self-pay | Admitting: *Deleted

## 2016-07-29 LAB — ABO AND RH: Rh Type: POSITIVE

## 2016-07-29 LAB — HCG, QUANTITATIVE, PREGNANCY: hCG, Beta Chain, Quant, S: 209.3 m[IU]/mL — ABNORMAL HIGH

## 2016-07-29 NOTE — Telephone Encounter (Signed)
-----   Message from Jerene BearsMary S Miller, MD sent at 07/29/2016 10:42 AM EDT ----- Please let pt know her HCG was 209.  This is good as she just had a positive UPT this week.   I'd like to repeat this one day next week if possible.  Doesn't matter what day just prior to Friday would be best.  Blood type is A+.

## 2016-07-29 NOTE — Telephone Encounter (Signed)
Left message to call Ryen Rhames at [phone].  

## 2016-07-29 NOTE — Telephone Encounter (Signed)
Spoke with patient, advised of results and recommendations as seen below per Dr. Hyacinth MeekerMiller. Patient scheduled for lab appointment on 08/03/16 at 9:45am. Patient verbalizes understanding and is agreeable.  Routing to provider for final review. Patient is agreeable to disposition. Will close encounter.

## 2016-08-03 ENCOUNTER — Other Ambulatory Visit (INDEPENDENT_AMBULATORY_CARE_PROVIDER_SITE_OTHER): Payer: BLUE CROSS/BLUE SHIELD

## 2016-08-03 DIAGNOSIS — N912 Amenorrhea, unspecified: Secondary | ICD-10-CM

## 2016-08-04 ENCOUNTER — Telehealth: Payer: Self-pay | Admitting: Obstetrics & Gynecology

## 2016-08-04 LAB — HCG, QUANTITATIVE, PREGNANCY: hCG, Beta Chain, Quant, S: 1142.5 m[IU]/mL — ABNORMAL HIGH

## 2016-08-04 NOTE — Telephone Encounter (Signed)
Dr.  Hyacinth MeekerMiller, please review hcg quant lab dated 08/03/16 and advise?

## 2016-08-04 NOTE — Telephone Encounter (Signed)
Patient is calling for recent lab results. Patient said she was told her results would be back today. I told patient that a nurse would call her once Dr.Miller has signed off on the results.

## 2016-08-04 NOTE — Telephone Encounter (Signed)
Spoke with patient, advised of results and recommendations as seen below per Dr. Miller. Patient verbalizes understanding and is agreeable.  Routing to provider for final review. Patient is agreeable to disposition. Will close encounter.  

## 2016-08-04 NOTE — Telephone Encounter (Signed)
Please let her know her HCG is 1142.  This has gone up appropriately.  She is scheduled for ultrasound next week.  I want her to keep this appt.  This is good.

## 2016-08-10 ENCOUNTER — Other Ambulatory Visit: Payer: Self-pay | Admitting: *Deleted

## 2016-08-10 DIAGNOSIS — Z3201 Encounter for pregnancy test, result positive: Secondary | ICD-10-CM

## 2016-08-10 DIAGNOSIS — N912 Amenorrhea, unspecified: Secondary | ICD-10-CM

## 2016-08-11 ENCOUNTER — Ambulatory Visit (INDEPENDENT_AMBULATORY_CARE_PROVIDER_SITE_OTHER): Payer: BLUE CROSS/BLUE SHIELD

## 2016-08-11 ENCOUNTER — Ambulatory Visit (INDEPENDENT_AMBULATORY_CARE_PROVIDER_SITE_OTHER): Payer: BLUE CROSS/BLUE SHIELD | Admitting: Obstetrics & Gynecology

## 2016-08-11 VITALS — BP 110/74 | HR 68 | Resp 16 | Ht 64.0 in | Wt 212.0 lb

## 2016-08-11 DIAGNOSIS — Z3201 Encounter for pregnancy test, result positive: Secondary | ICD-10-CM

## 2016-08-11 DIAGNOSIS — E282 Polycystic ovarian syndrome: Secondary | ICD-10-CM

## 2016-08-11 DIAGNOSIS — N912 Amenorrhea, unspecified: Secondary | ICD-10-CM

## 2016-08-11 DIAGNOSIS — Z3491 Encounter for supervision of normal pregnancy, unspecified, first trimester: Secondary | ICD-10-CM | POA: Diagnosis not present

## 2016-08-11 NOTE — Progress Notes (Signed)
36 y.o. 541P0000 Married Caucasian female here for viability scan.  Pt's LMP is 06/22/16.  Reports the breast tenderness she was having has decreased some and this has made her anxious.  Denies pelvic pain or vaginal bleeding.  Based on LMP, she should be 7 1/7 weeks.   Patient's last menstrual period was 06/22/2016.  Findings:  UTERUS: normal appearing gestational sac noted.  Two yolk sacs noted.  No obvious fetal poles or cardiac activity noted today. ADNEXA: Left ovary:  2.5 x 2.0cm with 1.7cm corpus luteal cyst       Right ovary: 3.1 x 3.0cm with 1.6cm corpus luteal cyst CUL DE SAC: no free fluid  Discussion:  FIndings reviewed with pt.  It appears she ovulated from both ovaries.  Ultrasound findings are consistent with twin pregnancy but dating via ultrasound if off by 11 days.  Recommend repeating PUS in 10-14 days to watch for progression.  Pt is, of course, anxious but there isn't anything else to do except wait and see what ultrasound shows.  She and spouse are here today.  Many questions.  All reviewed.  Both are comfortable with plan.  She is taking a PNV.  Assessment:  Early twin pregnancy?  Based on LMP, 11 day discrepancy noted today on PUS  Plan:  Repeat PUS two weeks.  Pt aware to call with any new questions or concerns.  She will stay on metformin until 8 weeks   ~Due to many questions and concerns expressed by pt, 25 minutes spent with patient with all of time spent in face to face discussion of above.

## 2016-08-14 ENCOUNTER — Encounter: Payer: Self-pay | Admitting: Obstetrics & Gynecology

## 2016-08-25 ENCOUNTER — Ambulatory Visit (INDEPENDENT_AMBULATORY_CARE_PROVIDER_SITE_OTHER): Payer: BLUE CROSS/BLUE SHIELD

## 2016-08-25 ENCOUNTER — Other Ambulatory Visit: Payer: Self-pay | Admitting: Obstetrics & Gynecology

## 2016-08-25 ENCOUNTER — Other Ambulatory Visit: Payer: Self-pay

## 2016-08-25 ENCOUNTER — Ambulatory Visit (INDEPENDENT_AMBULATORY_CARE_PROVIDER_SITE_OTHER): Payer: BLUE CROSS/BLUE SHIELD | Admitting: Obstetrics & Gynecology

## 2016-08-25 ENCOUNTER — Other Ambulatory Visit: Payer: Self-pay | Admitting: *Deleted

## 2016-08-25 VITALS — BP 120/86 | HR 84 | Resp 16 | Ht 64.0 in | Wt 208.0 lb

## 2016-08-25 DIAGNOSIS — N912 Amenorrhea, unspecified: Secondary | ICD-10-CM | POA: Diagnosis not present

## 2016-08-25 DIAGNOSIS — O3680X0 Pregnancy with inconclusive fetal viability, not applicable or unspecified: Secondary | ICD-10-CM | POA: Diagnosis not present

## 2016-08-25 DIAGNOSIS — O30041 Twin pregnancy, dichorionic/diamniotic, first trimester: Secondary | ICD-10-CM

## 2016-08-25 DIAGNOSIS — Z3201 Encounter for pregnancy test, result positive: Secondary | ICD-10-CM | POA: Diagnosis not present

## 2016-08-25 NOTE — Progress Notes (Signed)
36 y.o. 381P0000 Married Caucasian female here for pelvic ultrasound for viability scan.  She had ultrasound 08/12/15 with two gestational sacs and two yolk sacs noted.  No fetal poles are FCA were noted.  She continues to have breast tenderness.  Denies pelvic pain or bleeding.  Patient's last menstrual period was 06/22/2016.  Findings:  UTERUS:  Two gestational sacs and yolk sacs.  Twin A embryo with large yolk sac and CRL d/w 6 1/7.  Probable pregnancy failure.  Twin B embryo measured 5 6/7 without FCA.  Early pregnancy vs. Pregnancy failure.   ADNEXA: Left ovary: 2.7 x 2.0 cm with 1.7cm corpus luteal cyst       Right ovary: 3.0 x 2.0cm with 1.6cm corpus luteal cyst CUL DE SAC: no free fluid  Discussion:  Findings all reviewed with pt.  Although pregnancy does not look like there is going to be a successful twin pregnancy, there is chagne with ultrasound in comparison to prior ultrasound and I am not ready to say at this point that this pregnancy is definitively a failure.  Would not recommend any treatment to end pregnancy at this time and would recommend repeat PUS in 1 week.  Pt and spouse are aware that there is a decent chance of pregnancy failure but they are comfortable with this plan and do not want intervention at this time either.  Assessment:  Early twin pregnancy with findings c/w failure of at least one embryo  Plan:  Repeat PUS 1 week.  ~15 minutes spent with patient >50% of time was in face to face discussion of above.

## 2016-08-25 NOTE — Progress Notes (Signed)
Patient scheduled while in office for PUS in 1 week. Patient request 4pm appointment d/t work schedule. Patient scheduled on 09/01/16 at 4pm with consult to follow at 4:30pm with Dr.Miller. Patient is agreeable to date and time.

## 2016-08-28 ENCOUNTER — Encounter: Payer: Self-pay | Admitting: Obstetrics & Gynecology

## 2016-08-30 ENCOUNTER — Ambulatory Visit (INDEPENDENT_AMBULATORY_CARE_PROVIDER_SITE_OTHER): Payer: BLUE CROSS/BLUE SHIELD | Admitting: Obstetrics & Gynecology

## 2016-08-30 ENCOUNTER — Ambulatory Visit (INDEPENDENT_AMBULATORY_CARE_PROVIDER_SITE_OTHER): Payer: BLUE CROSS/BLUE SHIELD

## 2016-08-30 ENCOUNTER — Telehealth: Payer: Self-pay | Admitting: Obstetrics & Gynecology

## 2016-08-30 VITALS — BP 126/76 | HR 96 | Resp 16 | Ht 64.0 in | Wt 208.0 lb

## 2016-08-30 DIAGNOSIS — Z3201 Encounter for pregnancy test, result positive: Secondary | ICD-10-CM | POA: Diagnosis not present

## 2016-08-30 DIAGNOSIS — N912 Amenorrhea, unspecified: Secondary | ICD-10-CM

## 2016-08-30 DIAGNOSIS — O034 Incomplete spontaneous abortion without complication: Secondary | ICD-10-CM

## 2016-08-30 NOTE — Progress Notes (Signed)
36 y.o. 931P0000 Married Caucasian female here for pelvic ultrasound due to first trimester spotting that started today.  Ultrasounds have been progressing but not at typical and expected timing with this pregnancy.  Two sacs with embryos have been noted but no fetal cardiac activity at this point has been seen.  Feeling mildly crampy today.  MBT is A+.  Patient's last menstrual period was 06/22/2016.  Findings:  UTERUS: Two gestational sacs with embryos noted.  No change in size in last five days.  No FCA noted.  CRL around 6 weeks for both embryos.  Beginning of breakdown of gestational sac noted with some probable blood within endometrial cavity noted ADNEXA: Left ovary: 3.4cm       Right ovary: 2.7cm CUL DE SAC: no free fluid noted  Discussion:  Finding reviewed. At this point, non-viable pregnancy noted.  Options of natural miscarriage vs misoprostol vs suction D&C reviewed.  Procedures, risks and benefits discussed.  Pt wants to see what will happen naturally at this point.  Aware if no miscarriage within two weeks, would recommend proceeding with additional treatment.  Pt aware I will want to follow down HCG levels so advised to let me know when bleeding increases.  Also aware of MBT so Rhogam not indicated.  All questions answered.  Assessment:  Incomplete abortion  Plan:  Pt will monitor for increased bleeding.  Precautions for being seen or seeking emergent care reviewed.  Pt will consider alternative options as well if miscarriage does not proceed naturally.  Asked pt to call and give update on Friday if possible. Will plan to follow down HCG levels as well.  All questions answered.  Support given.  ~25 minutes spent with patient >50% of time was in face to face discussion of above.

## 2016-08-30 NOTE — Telephone Encounter (Signed)
Spoke with patient. Advised of message as seen below from Dr.Miller. Patient is agreeable and will keep appointment for today.  Routing to provider for final review. Patient agreeable to disposition. Will close encounter.

## 2016-08-30 NOTE — Telephone Encounter (Signed)
This is fine to move appt and ultrasound to today.  Thanks.

## 2016-08-30 NOTE — Telephone Encounter (Signed)
Spoke with patient. Patient states that she began having light spotting when she went to the restroom around 11:30 am. Spotting has continued. Is having mild cramping reports 1-2 on a pain scale out of 10. Denies any sharp stabbing pain or pain with using the restroom. Patient was seen last week on 67/2018 for viability scan. Is to return for follow up ultrasound on 09/01/2016. Advised patient she will need to be seen earlier for evaluation. Patient is agreeable. Appointment for ultrasound moved to 08/30/2016 at 3:30 pm with 4 pm consult with Dr.Miller. Patient is worried about coming early for ultrasound and needing to return again on 09/01/2016 due to work schedule and emotions around having the ultrasound. Advised will review with Dr.Miller upon arrival to the office and will contact her with recommendations from Dr.Miller. Aware to still plan for appointment today at 3:30 pm until RN speaks with Dr.Miller.

## 2016-08-30 NOTE — Telephone Encounter (Signed)
Patient is pregnant and is spotting.

## 2016-09-01 ENCOUNTER — Other Ambulatory Visit: Payer: Self-pay | Admitting: Obstetrics & Gynecology

## 2016-09-01 ENCOUNTER — Other Ambulatory Visit: Payer: Self-pay

## 2016-09-02 ENCOUNTER — Encounter: Payer: Self-pay | Admitting: Obstetrics & Gynecology

## 2016-09-20 ENCOUNTER — Other Ambulatory Visit: Payer: Self-pay | Admitting: Obstetrics & Gynecology

## 2016-09-20 ENCOUNTER — Telehealth: Payer: Self-pay | Admitting: Obstetrics & Gynecology

## 2016-09-20 DIAGNOSIS — O039 Complete or unspecified spontaneous abortion without complication: Secondary | ICD-10-CM

## 2016-09-20 NOTE — Telephone Encounter (Signed)
Spoke with patient. Advised Dr.Miller has placed order for hcg quant. Okay for patient to be seen on 09/22/2016 at 9 am. Patient verbalizes understanding.  Routing to provider for final review. Patient agreeable to disposition. Will close encounter.

## 2016-09-20 NOTE — Telephone Encounter (Signed)
Patient is scheduled for labs Thursday morning to check hormone levels but she want to make sure she is supposed to come in around this time.

## 2016-09-22 ENCOUNTER — Other Ambulatory Visit (INDEPENDENT_AMBULATORY_CARE_PROVIDER_SITE_OTHER): Payer: BLUE CROSS/BLUE SHIELD

## 2016-09-22 ENCOUNTER — Other Ambulatory Visit: Payer: Self-pay | Admitting: Obstetrics & Gynecology

## 2016-09-22 DIAGNOSIS — O039 Complete or unspecified spontaneous abortion without complication: Secondary | ICD-10-CM

## 2016-09-22 DIAGNOSIS — N970 Female infertility associated with anovulation: Secondary | ICD-10-CM

## 2016-09-23 LAB — PROGESTERONE: Progesterone: 0.2 ng/mL

## 2016-09-23 LAB — BETA HCG QUANT (REF LAB): HCG QUANT: 5 m[IU]/mL

## 2016-10-07 IMAGING — CT CT ABD-PELV W/ CM
1 of 2 series · 15 of 32 positions shown, 19 images · IV contrast (omnipaque)
Comparison: None.

CLINICAL DATA: Right lower quadrant sharp stabbing pain for 2 days.

EXAM:
CT ABDOMEN AND PELVIS WITH CONTRAST
TECHNIQUE: Multidetector CT imaging of the abdomen and pelvis was performed
using the standard protocol following bolus administration of
intravenous contrast.
CONTRAST:  50mL OMNIPAQUE IOHEXOL 300 MG/ML SOLN, 100mL OMNIPAQUE
IOHEXOL 300 MG/ML SOLN

[Series 2: abd/pel with · axial · 0.81mm/px · z∈[+1098,+1498]mm · 15 of 88 slices shown, 19 images]
[im 4/88  soft-tissue]
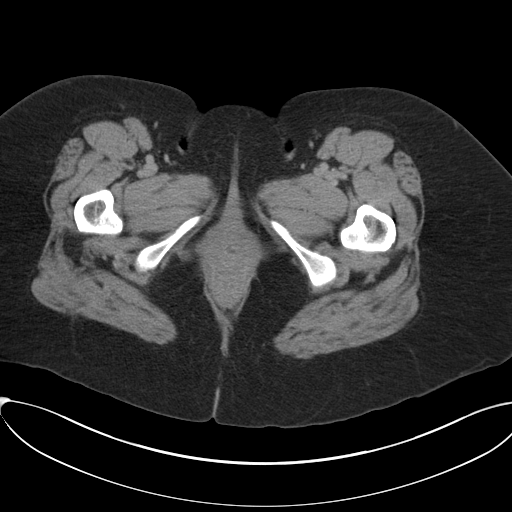
[im 4/88  bone]
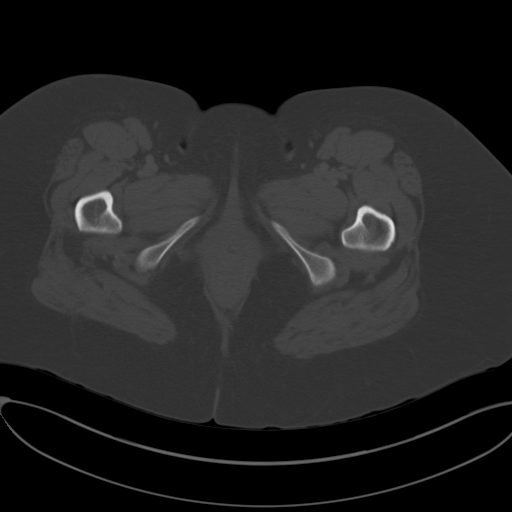
[im 12/88  soft-tissue]
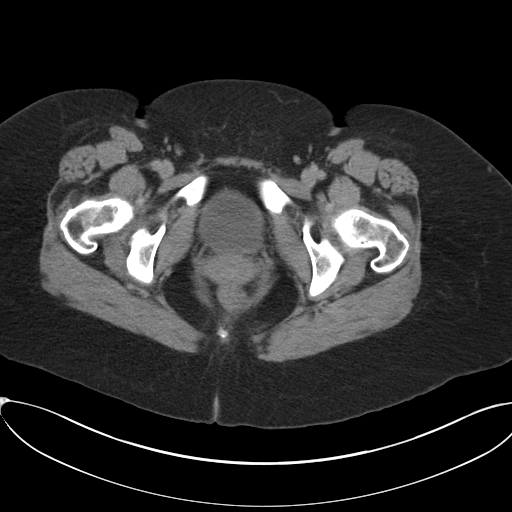
[im 19/88  soft-tissue]
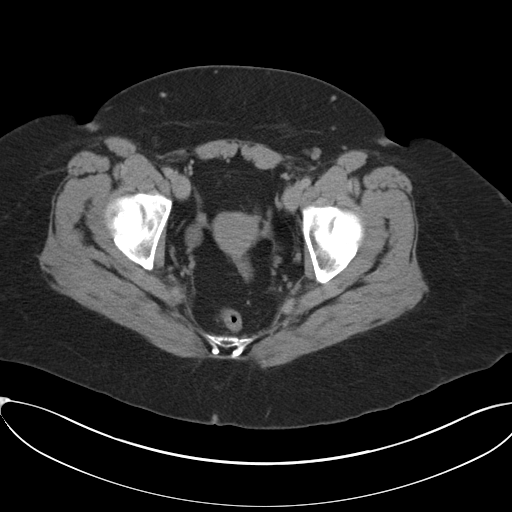
[im 23/88  soft-tissue]
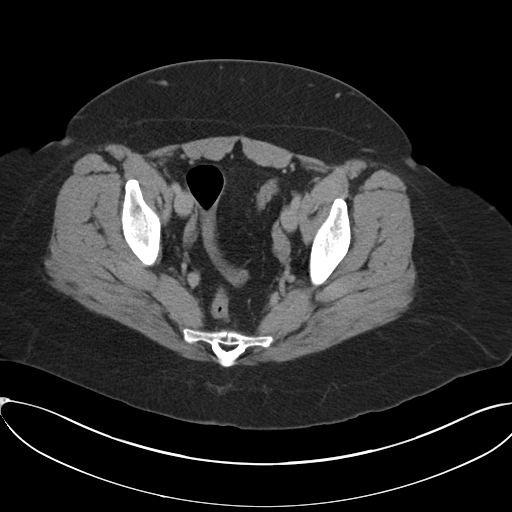
[im 31/88  soft-tissue]
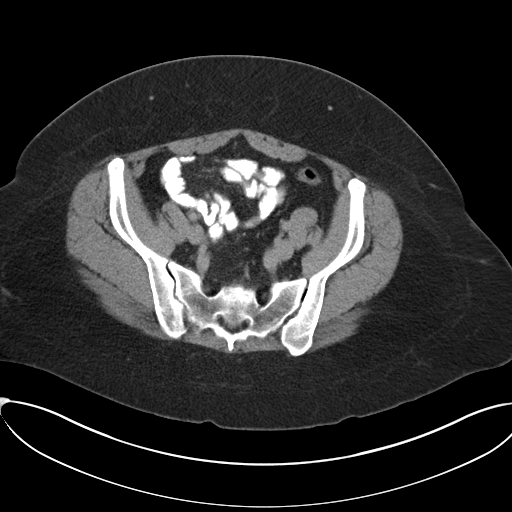
[im 38/88  soft-tissue]
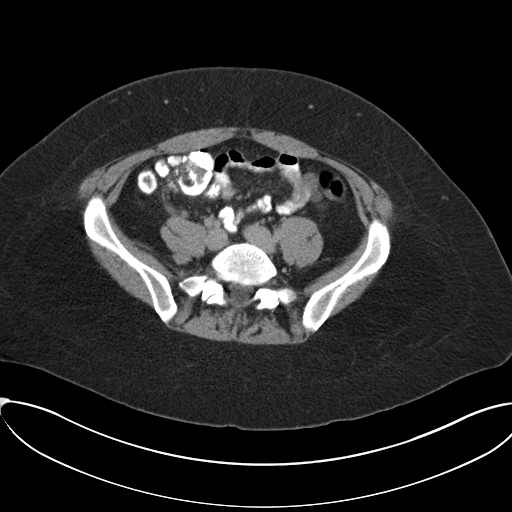
[im 46/88  soft-tissue]
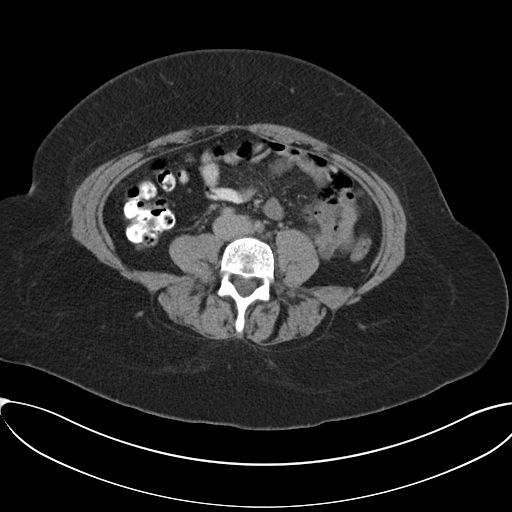
[im 50/88  soft-tissue]
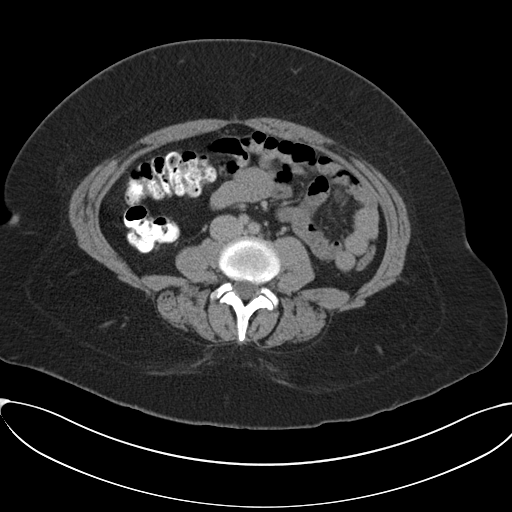
[im 57/88  soft-tissue]
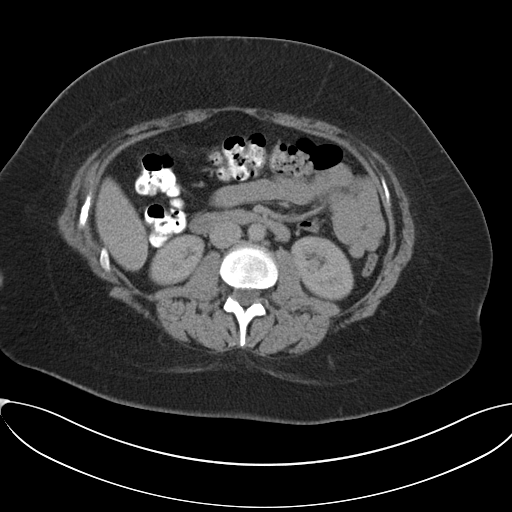
[im 57/88  bone]
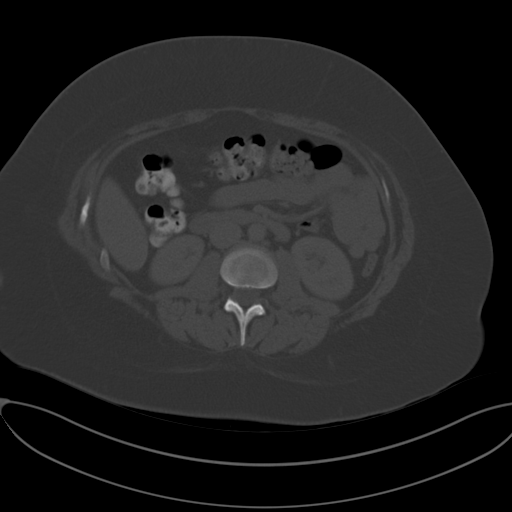
[im 65/88  soft-tissue]
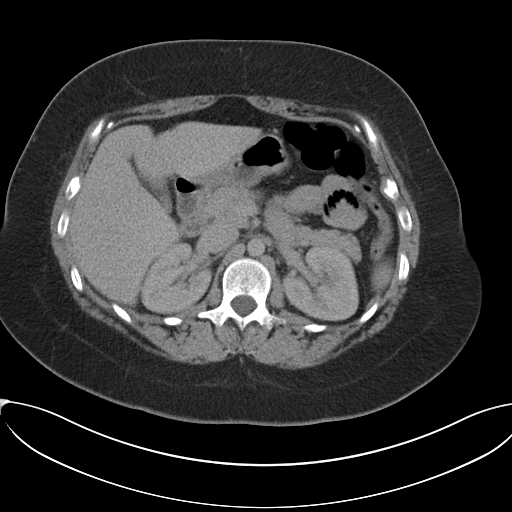
[im 69/88  soft-tissue]
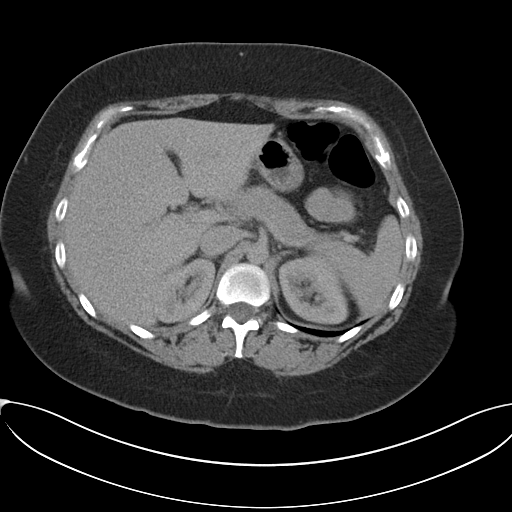
[im 72/88  lung]
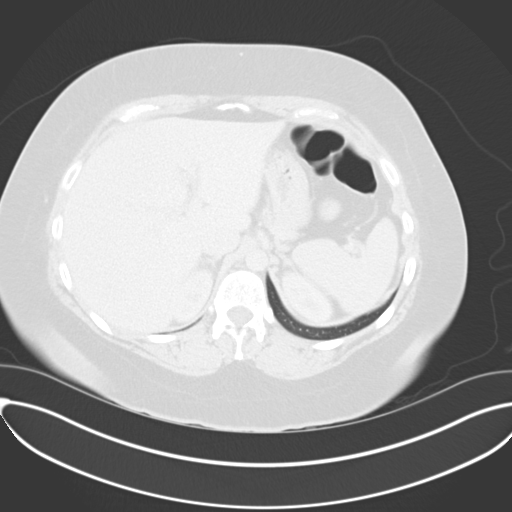
[im 76/88  soft-tissue]
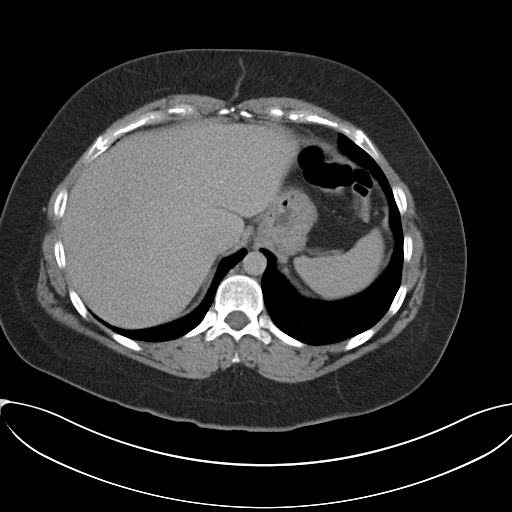
[im 76/88  lung]
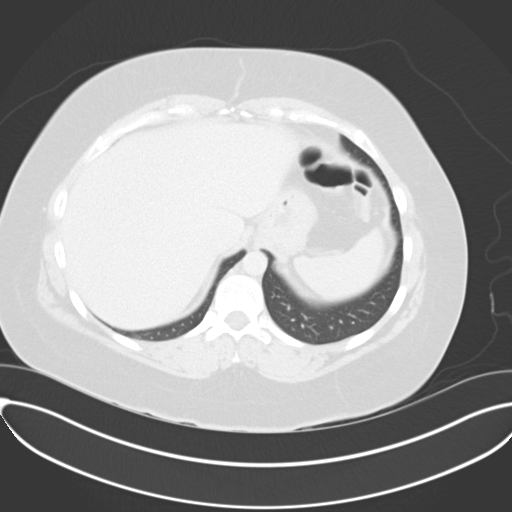
[im 80/88  lung]
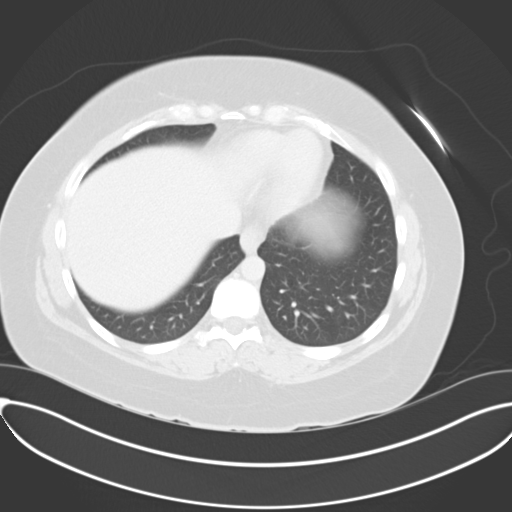
[im 84/88  soft-tissue]
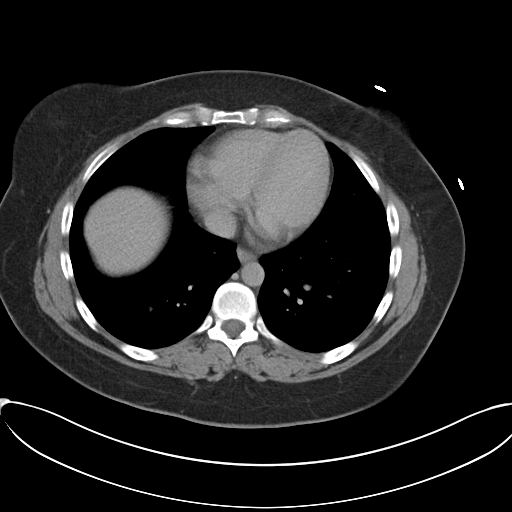
[im 84/88  lung]
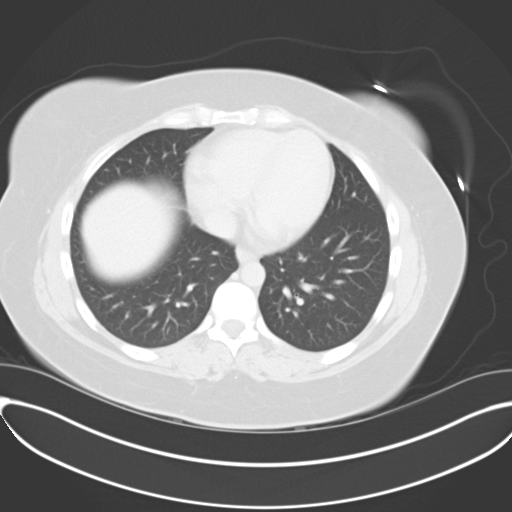

[15 of 32 positions shown; findings below may reference images not displayed]

FINDINGS: BODY WALL: Unremarkable.

LOWER CHEST: Unremarkable.

ABDOMEN/PELVIS:

Liver: No focal abnormality.

Biliary: No evidence of biliary obstruction or stone.

Pancreas: Unremarkable.

Spleen: Unremarkable.

Adrenals: Unremarkable.

Kidneys and ureters: No hydronephrosis or stone.

Bladder: Unremarkable.

Reproductive: Unremarkable.

Bowel: No obstruction. No inflammatory bowel wall thickening.
Negative appendix.

Retroperitoneum: No mass or adenopathy.

Peritoneum: No ascites or pneumoperitoneum.

Vascular: No acute abnormality.

OSSEOUS: No acute abnormalities.
IMPRESSION: No findings to explain right lower quadrant pain. Negative appendix.

## 2016-10-13 ENCOUNTER — Telehealth: Payer: Self-pay | Admitting: Obstetrics & Gynecology

## 2016-10-13 NOTE — Telephone Encounter (Signed)
Left message to call Jasson Siegmann at [phone].  

## 2016-10-13 NOTE — Telephone Encounter (Signed)
Returning a call to Jill H. °

## 2016-10-13 NOTE — Telephone Encounter (Signed)
Spoke with patient, requesting Rx for Femara. Reports first menses since miscarriage and is different. Started night of 7/24 -mostly mucous discharge with few small brown clots, only when wiping. Does not require pad. Denies pain or odor.   Advised patient would review with Dr. Hyacinth MeekerMiller and return call with recommendations, patient is agreeable.  Dr. Hyacinth MeekerMiller -please advise on RX?

## 2016-10-13 NOTE — Telephone Encounter (Signed)
Patient states cycle has started and Dr Hyacinth MeekerMiller [place].

## 2016-10-14 MED ORDER — LETROZOLE 2.5 MG PO TABS: ORAL_TABLET | ORAL | 0 refills | Status: DC

## 2016-10-14 NOTE — Telephone Encounter (Signed)
Please call pt and get update on cycle.  If this has continued on like a normal cycle, ok to start Femara 7.5mg  days 5-9 of cycle.  If still irregular bleeding, I would wait through this cycle and not start until her next normal cycle begins.

## 2016-10-14 NOTE — Telephone Encounter (Signed)
Spoke with patient. Reports cycle continued into "normal flow". Advised as seen below per Dr. Hyacinth MeekerMiller. Rx for Femara to verified pharmacy on file. Patient verbalizes understanding and is agreeable.  Routing to provider for final review. Patient is agreeable to disposition. Will close encounter.

## 2016-11-08 ENCOUNTER — Ambulatory Visit: Payer: BLUE CROSS/BLUE SHIELD | Admitting: Nurse Practitioner

## 2016-11-10 NOTE — Progress Notes (Signed)
36 y.o. Diamond Hunter MarriedCaucasianF here for annual exam.  Doing well.  Working on being licensed for foster parenting.  Has new job doing Estate agent work.  Really excited about new job.  Patient's last menstrual period was 10/12/2016 (approximate).          Sexually active: Yes.    The current method of family planning is none.    Exercising: No.  The patient does not participate in regular exercise at present. Smoker:  no  Health Maintenance: Pap:  11/04/14 negative, HR HPV negative  History of abnormal Pap:  no MMG:  Never TDaP:  03/21/09  Pneumonia vaccine(s):  N/A Zostavax:   N/A Hep C testing: not indicated  Screening Labs: will discuss    reports that she has never smoked. She has never used smokeless tobacco. She reports that she drinks about 0.6 oz of alcohol per week . She reports that she does not use drugs.  Past Medical History:  Diagnosis Date  . Migraine headache without aura 06/2010  . PCOS (polycystic ovarian syndrome)    suspected    Past Surgical History:  Procedure Laterality Date  . WISDOM TOOTH EXTRACTION  1995    Current Outpatient Prescriptions  Medication Sig Dispense Refill  . letrozole (FEMARA) 2.5 MG tablet Take 3 tablets (7.5 mg total) days 5-9 of menses. 15 tablet 0  . metFORMIN (GLUCOPHAGE) 500 MG tablet 1 po qd.  After 14 days, can increase to BID. 60 tablet 2  . Prenatal Vit-Fe Fumarate-FA (MULTIVITAMIN-PRENATAL) 27-0.8 MG TABS tablet Take 1 tablet by mouth daily at 12 noon.     No current facility-administered medications for this visit.     Family History  Problem Relation Age of Onset  . Bladder Cancer Father 65       bladder cancer  . Heart failure Maternal Grandmother   . Diabetes Maternal Grandmother   . Heart failure Paternal Grandmother   . Hyperlipidemia Paternal Grandmother   . Hypertension Paternal Grandmother   . Lung cancer Paternal Grandfather        smoker  . Prostate cancer Paternal Grandfather     ROS:  Pertinent items are  noted in HPI.  Otherwise, a comprehensive ROS was negative.  Exam:   BP 110/70 (BP Location: Left Arm, Patient Position: Sitting, Cuff Size: Large)   Pulse 74   Resp 16   Ht 5\' 4"  (1.626 m)   Wt 201 lb (91.2 kg)   LMP 10/12/2016 (Approximate)   Breastfeeding? No   BMI 34.50 kg/m   Weight change: -12#   Height: 5\' 4"  (162.6 cm)  Ht Readings from Last 3 Encounters:  11/11/16 5\' 4"  (1.626 m)  08/30/16 5\' 4"  (1.626 m)  08/25/16 5\' 4"  (1.626 m)    General appearance: alert, cooperative and appears stated age Head: Normocephalic, without obvious abnormality, atraumatic Neck: no adenopathy, supple, symmetrical, trachea midline and thyroid normal to inspection and palpation Lungs: clear to auscultation bilaterally Breasts: normal appearance, no masses or tenderness Heart: regular rate and rhythm Abdomen: soft, non-tender; bowel sounds normal; no masses,  no organomegaly Extremities: extremities normal, atraumatic, no cyanosis or edema Skin: Skin color, texture, turgor normal. No rashes or lesions Lymph nodes: Cervical, supraclavicular, and axillary nodes normal. No abnormal inguinal nodes palpated Neurologic: Grossly normal   Pelvic: External genitalia:  no lesions              Urethra:  normal appearing urethra with no masses, tenderness or lesions  Bartholins and Skenes: normal                 Vagina: normal appearing vagina with normal color and discharge, no lesions              Cervix: no lesions              Pap taken: No. Bimanual Exam:  Uterus:  normal size, contour, position, consistency, mobility, non-tender              Adnexa: normal adnexa and no mass, fullness, tenderness               Rectovaginal: Confirms               Anus:  normal sphincter tone, no lesions  Chaperone was present for exam.  A:  Well Woman with normal exam Miscarriage earlier this year H/O PCOS Intentional weight loss  P:   Mammogram guidelines reviewed Rx for Metformin XL  500mg  daily.  #30/12RF On PNV daily Rx for Femara 7.5mg  days 3-7 to pharmacy.  Consider day 21,22 or 23 progesterone level depending on what day she takes the Femara.  Order given. Pt will let me know if she proceeds with this. Pap smear not indicated return annually or prn

## 2016-11-11 ENCOUNTER — Ambulatory Visit (INDEPENDENT_AMBULATORY_CARE_PROVIDER_SITE_OTHER): Payer: BLUE CROSS/BLUE SHIELD | Admitting: Obstetrics & Gynecology

## 2016-11-11 ENCOUNTER — Encounter: Payer: Self-pay | Admitting: Obstetrics & Gynecology

## 2016-11-11 VITALS — BP 110/70 | HR 74 | Resp 16 | Ht 64.0 in | Wt 201.0 lb

## 2016-11-11 DIAGNOSIS — H5213 Myopia, bilateral: Secondary | ICD-10-CM | POA: Diagnosis not present

## 2016-11-11 DIAGNOSIS — E282 Polycystic ovarian syndrome: Secondary | ICD-10-CM | POA: Diagnosis not present

## 2016-11-11 DIAGNOSIS — Z01419 Encounter for gynecological examination (general) (routine) without abnormal findings: Secondary | ICD-10-CM

## 2016-11-11 MED ORDER — LETROZOLE 2.5 MG PO TABS: ORAL_TABLET | ORAL | 0 refills | Status: DC

## 2016-11-11 MED ORDER — METFORMIN HCL ER 500 MG PO TB24: 500.0000 mg | ORAL_TABLET | Freq: Every day | ORAL | 12 refills | Status: DC

## 2016-12-12 ENCOUNTER — Other Ambulatory Visit: Payer: Self-pay | Admitting: Obstetrics & Gynecology

## 2016-12-12 MED ORDER — LETROZOLE 2.5 MG PO TABS: ORAL_TABLET | ORAL | 0 refills | Status: DC

## 2016-12-12 NOTE — Telephone Encounter (Signed)
Patient requesting a refill of Femara

## 2016-12-12 NOTE — Telephone Encounter (Signed)
Medication refill request: Femara  Last AEX:  11-11-16  Next AEX: 01-18-18 Last MMG (if hormonal medication request): N/A Refill authorized: please advise

## 2017-01-18 ENCOUNTER — Other Ambulatory Visit: Payer: Self-pay | Admitting: Obstetrics & Gynecology

## 2017-01-18 MED ORDER — LETROZOLE 2.5 MG PO TABS: ORAL_TABLET | ORAL | 0 refills | Status: DC

## 2017-01-18 NOTE — Telephone Encounter (Signed)
Refill completed.  Please let pt know.

## 2017-01-18 NOTE — Telephone Encounter (Signed)
Patient notified

## 2017-01-18 NOTE — Telephone Encounter (Signed)
Medication refill request: femara  Last AEX:  11/11/16 SM Next AEX: 01/18/18 SM Last MMG (if hormonal medication request): none Refill authorized: 12/12/16 #[id number]. Today please advise.

## 2017-01-18 NOTE — Telephone Encounter (Signed)
Patient is asking is asking for a refill of Femara to the pharmacy on file.

## 2017-01-25 ENCOUNTER — Telehealth: Payer: Self-pay | Admitting: Obstetrics & Gynecology

## 2017-01-25 NOTE — Telephone Encounter (Signed)
Patient states she faxed over a medical evaluation form on Monday.  Wants to confirm we received it and would like to know when she can expect to receive it back.

## 2017-01-25 NOTE — Telephone Encounter (Signed)
Call to patient. Advised patient that Dr. Hyacinth MeekerMiller is out of the office today, but would review with her tomorrow about form and return call to patient. Patient states she was just checking to see if form had been received by our office because she needs it back by Thursday 11/15 at the latest.   Routing to provider for review.

## 2017-01-27 NOTE — Telephone Encounter (Signed)
Form filled out and faxed today, 01/27/17.  Pt notified via phone.  Left detailed message on phone number indicated in DPR.

## 2017-01-30 NOTE — Telephone Encounter (Signed)
Message noted from Dr. Miller. Encounter closed. 

## 2017-02-03 DIAGNOSIS — N3090 Cystitis, unspecified without hematuria: Secondary | ICD-10-CM | POA: Diagnosis not present

## 2017-02-03 DIAGNOSIS — R1030 Lower abdominal pain, unspecified: Secondary | ICD-10-CM | POA: Diagnosis not present

## 2017-02-03 DIAGNOSIS — Z6833 Body mass index (BMI) 33.0-33.9, adult: Secondary | ICD-10-CM | POA: Diagnosis not present

## 2017-02-20 ENCOUNTER — Other Ambulatory Visit: Payer: Self-pay | Admitting: Obstetrics & Gynecology

## 2017-02-20 ENCOUNTER — Telehealth: Payer: Self-pay | Admitting: Obstetrics & Gynecology

## 2017-02-20 MED ORDER — LETROZOLE 2.5 MG PO TABS: ORAL_TABLET | ORAL | 0 refills | Status: DC

## 2017-02-20 NOTE — Telephone Encounter (Signed)
Spoke with patient. Patient states that she started her menses on 02/18/2017. Today is day 3 of her menses. Needs refill of Femara. Rx for Femara 2.5 mg take 3 tablets total (7.5 mg) days 5-9 of menses #15 0RF. Will take a pregnancy test at day 30 os menses if no cycle and report results to our office. States she faxed a form to Dr.Miller that was completed in November. States she did not receive the faxed form, but it was already sent to scan. Asking for it to be refaxed. Form printed from media and refaxed to [phone] with confirmation.  Routing to provider for final review. Patient agreeable to disposition. Will close encounter.

## 2017-02-20 NOTE — Telephone Encounter (Signed)
Patient would like to speak with nurse about a refill on medication and a form that was refaxed to Dr Hyacinth MeekerMiller to fill out.

## 2017-03-06 DIAGNOSIS — N3090 Cystitis, unspecified without hematuria: Secondary | ICD-10-CM | POA: Diagnosis not present

## 2017-03-06 DIAGNOSIS — Z6834 Body mass index (BMI) 34.0-34.9, adult: Secondary | ICD-10-CM | POA: Diagnosis not present

## 2017-03-06 DIAGNOSIS — N3000 Acute cystitis without hematuria: Secondary | ICD-10-CM | POA: Diagnosis not present

## 2017-03-22 ENCOUNTER — Other Ambulatory Visit: Payer: Self-pay | Admitting: Obstetrics & Gynecology

## 2017-03-22 ENCOUNTER — Telehealth: Payer: Self-pay | Admitting: Obstetrics & Gynecology

## 2017-03-22 DIAGNOSIS — N912 Amenorrhea, unspecified: Secondary | ICD-10-CM

## 2017-03-22 NOTE — Progress Notes (Signed)
HCG quantitative order placed.

## 2017-03-22 NOTE — Telephone Encounter (Signed)
Spoke to pt.  HCG orders placed for HCG to be done at [facility].  She will let me know when this is done and then I will place order for second HCG next week.  Ok to close encounter.

## 2017-03-22 NOTE — Telephone Encounter (Signed)
Patient has taken two UPT and they both are positive.

## 2017-03-22 NOTE — Telephone Encounter (Addendum)
Return call to patient. Today is day 33 of cycle. LMP 02-18-17. Took Femara days 5-9.  Positive UPT yesterday afternoon and today early am. History of miscarriage in June 2018. Was approved as foster parents and received a newborn baby for placement on 03-07-17!  Calling for instructions related to current pregnancy related to previous SAB. If needs labs, requests to have in [place] due to caring for newborn.

## 2017-03-23 ENCOUNTER — Other Ambulatory Visit: Payer: Self-pay | Admitting: Obstetrics & Gynecology

## 2017-03-23 ENCOUNTER — Telehealth: Payer: Self-pay | Admitting: Obstetrics & Gynecology

## 2017-03-23 DIAGNOSIS — Z8759 Personal history of other complications of pregnancy, childbirth and the puerperium: Secondary | ICD-10-CM

## 2017-03-23 DIAGNOSIS — O039 Complete or unspecified spontaneous abortion without complication: Secondary | ICD-10-CM | POA: Diagnosis not present

## 2017-03-23 DIAGNOSIS — N912 Amenorrhea, unspecified: Secondary | ICD-10-CM

## 2017-03-23 NOTE — Progress Notes (Signed)
HCG order for Monday, 03/27/17, placed.  Pt will do at local [facility].

## 2017-03-23 NOTE — Telephone Encounter (Signed)
I will want to do it Monday.  Orders have been placed.

## 2017-03-23 NOTE — Telephone Encounter (Signed)
Routing to Dr. Hyacinth MeekerMiller -please advise when next Hcg to be drawn?

## 2017-03-23 NOTE — Telephone Encounter (Signed)
Patient calling to let us know she wend to [facility] and had her blood work done today.

## 2017-03-23 NOTE — Telephone Encounter (Signed)
Left detailed message, name identified on voicemail, ok per current dpr. Advised to repeat hcg level on Monday, 03/27/17, orders have been placed. Our office will return call once labs dated 1/3 have been received and reviewed by Dr. Hyacinth MeekerMiller. Return call to office with any additional questions/conerns.   Routing to provider for final review.  Will close encounter.

## 2017-03-24 LAB — BETA HCG QUANT (REF LAB): hCG Quant: 388 m[IU]/mL

## 2017-03-27 ENCOUNTER — Telehealth: Payer: Self-pay | Admitting: Obstetrics & Gynecology

## 2017-03-27 DIAGNOSIS — N912 Amenorrhea, unspecified: Secondary | ICD-10-CM

## 2017-03-27 DIAGNOSIS — Z8759 Personal history of other complications of pregnancy, childbirth and the puerperium: Secondary | ICD-10-CM

## 2017-03-27 NOTE — Telephone Encounter (Signed)
New order placed for hcg quant and released.   Baird LyonsCasey, [facility] Tech called [place] [phone], confirmed order received.   Call returned to patient. Apologies to patient, advised new order placed to [facility]. Patient states she will go back to lab on 1/8, will call office to notify.   Routing to provider for final review. Patient is agreeable to disposition. Will close encounter.

## 2017-03-27 NOTE — Telephone Encounter (Signed)
[place] just [place] [facility] in [place] and was told there was no order submitted for her blood work. Patient asked if the nurse could call her and let her know when this order has been sent.

## 2017-03-28 DIAGNOSIS — N912 Amenorrhea, unspecified: Secondary | ICD-10-CM | POA: Diagnosis not present

## 2017-03-28 DIAGNOSIS — Z8759 Personal history of other complications of pregnancy, childbirth and the puerperium: Secondary | ICD-10-CM | POA: Diagnosis not present

## 2017-03-29 ENCOUNTER — Telehealth: Payer: Self-pay

## 2017-03-29 DIAGNOSIS — N912 Amenorrhea, unspecified: Secondary | ICD-10-CM

## 2017-03-29 LAB — BETA HCG QUANT (REF LAB): HCG QUANT: 2305 m[IU]/mL

## 2017-03-29 NOTE — Telephone Encounter (Signed)
-----   Message from Jerene BearsMary S Miller, MD sent at 03/29/2017  9:40 AM EST ----- Please let pt know her HCG is 2305.  Looks great!!.  Can do ultrasound in one to two weeks.  Might see heartbeat next week.  Should see it in two weeks.  Pt has miscarriage with first pregnancy so happy to see her for ultrasound whenever it feels best for her.  Thanks.

## 2017-03-29 NOTE — Telephone Encounter (Signed)
Spoke with patient. Results given. Patient will be going out of town in two weeks. Requests ultrasound next week. Appointment scheduled for 04/06/2017 at 3:30 pm with 4 pm consult with Dr.Miller. Patient is agreeable to date and time. Order placed. Encounter closed.

## 2017-04-06 ENCOUNTER — Encounter: Payer: Self-pay | Admitting: Obstetrics & Gynecology

## 2017-04-06 ENCOUNTER — Ambulatory Visit (INDEPENDENT_AMBULATORY_CARE_PROVIDER_SITE_OTHER): Payer: BLUE CROSS/BLUE SHIELD

## 2017-04-06 ENCOUNTER — Ambulatory Visit (INDEPENDENT_AMBULATORY_CARE_PROVIDER_SITE_OTHER): Payer: BLUE CROSS/BLUE SHIELD | Admitting: Obstetrics & Gynecology

## 2017-04-06 VITALS — BP 126/70 | HR 76 | Resp 16 | Ht 64.0 in | Wt 191.0 lb

## 2017-04-06 DIAGNOSIS — N912 Amenorrhea, unspecified: Secondary | ICD-10-CM | POA: Diagnosis not present

## 2017-04-09 ENCOUNTER — Encounter: Payer: Self-pay | Admitting: Obstetrics & Gynecology

## 2017-04-09 NOTE — Progress Notes (Signed)
37 y.o. [id number] Married Caucasian female here for pelvic ultrasound for viability assessment.  Feeling fine.  No nausea.  She is having breast tenderness and fatigue.  Denies pelvic pain or bleeding.  Patient's last menstrual period was 02/18/2017.  Used Femara this past month.  Sure of date of conception which would put her at 6 1/7 weeks.  Findings:  UTERUS:  Normal yolk sac, gestational sac, fetal pole.  Single embryo noted.  EGA by PUS is 5 5/7 weeks with EDGE 11/01/17.  +FCA noted at 106 BPM.   ADNEXA: Left ovary and right ovary appear normal with appearance of corpus luteal cyst on each side CUL DE SAC: no free fluid  Discussion:  No cats in the home.  Tdap 2011.  Had chicken pox.  Aware flu vaccine safe and recommended.   Fish/shellfish/mercury discuss.  Patient does not eat much fish.  Unpasteurized cheese/juices discussed.  Nitrites in foods disucssed.  Exercise and intercourse discussed.  Fetal DNA particle testing discussed.  First trimester down's testing discussed.  Cystic fibrosis discussed. .  Assessment:  Singleton IUD at 295 5/7 by early first trimester ultrasound  Plan:  Transfer of care appropriate.  Pt will let us know where she is going to transfer care for transfer of records.  Pt wished well.  ~15 minutes spent with patient >50% of time was in face to face discussion of above.

## 2017-05-11 DIAGNOSIS — Z3481 Encounter for supervision of other normal pregnancy, first trimester: Secondary | ICD-10-CM | POA: Diagnosis not present

## 2017-05-11 DIAGNOSIS — Z3480 Encounter for supervision of other normal pregnancy, unspecified trimester: Secondary | ICD-10-CM | POA: Diagnosis not present

## 2017-05-13 ENCOUNTER — Other Ambulatory Visit: Payer: Self-pay

## 2017-05-13 ENCOUNTER — Encounter (HOSPITAL_COMMUNITY): Payer: Self-pay

## 2017-05-13 ENCOUNTER — Inpatient Hospital Stay (HOSPITAL_COMMUNITY): Payer: BLUE CROSS/BLUE SHIELD

## 2017-05-13 ENCOUNTER — Inpatient Hospital Stay (HOSPITAL_COMMUNITY)
Admission: AD | Admit: 2017-05-13 | Discharge: 2017-05-13 | Disposition: A | Discharge status: Home / Self Care | Payer: BLUE CROSS/BLUE SHIELD | Source: Ambulatory Visit | Attending: Obstetrics & Gynecology | Admitting: Obstetrics & Gynecology

## 2017-05-13 DIAGNOSIS — Z3A01 Less than 8 weeks gestation of pregnancy: Secondary | ICD-10-CM | POA: Diagnosis not present

## 2017-05-13 DIAGNOSIS — Z3A11 11 weeks gestation of pregnancy: Secondary | ICD-10-CM | POA: Insufficient documentation

## 2017-05-13 DIAGNOSIS — O209 Hemorrhage in early pregnancy, unspecified: Secondary | ICD-10-CM | POA: Diagnosis present

## 2017-05-13 DIAGNOSIS — O021 Missed abortion: Secondary | ICD-10-CM | POA: Diagnosis not present

## 2017-05-13 DIAGNOSIS — O26851 Spotting complicating pregnancy, first trimester: Secondary | ICD-10-CM | POA: Diagnosis not present

## 2017-05-13 LAB — URINALYSIS, ROUTINE W REFLEX MICROSCOPIC
Bilirubin Urine: NEGATIVE
Glucose, UA: NEGATIVE mg/dL
Ketones, ur: 20 mg/dL — AB
Leukocytes, UA: NEGATIVE
Nitrite: NEGATIVE
PH: 6 (ref 5.0–8.0)
Protein, ur: NEGATIVE mg/dL
SPECIFIC GRAVITY, URINE: 1.003 — AB (ref 1.005–1.030)

## 2017-05-13 LAB — CBC
HEMATOCRIT: 37.4 % (ref 36.0–46.0)
Hemoglobin: 12.5 g/dL (ref 12.0–15.0)
MCH: 30.9 pg (ref 26.0–34.0)
MCHC: 33.4 g/dL (ref 30.0–36.0)
MCV: 92.3 fL (ref 78.0–100.0)
PLATELETS: 312 10*3/uL (ref 150–400)
RBC: 4.05 MIL/uL (ref 3.87–5.11)
RDW: 13.2 % (ref 11.5–15.5)
WBC: 8.5 10*3/uL (ref 4.0–10.5)

## 2017-05-13 LAB — ABO/RH: ABO/RH(D): A POS

## 2017-05-13 LAB — WET PREP, GENITAL
Clue Cells Wet Prep HPF POC: NONE SEEN
SPERM: NONE SEEN
TRICH WET PREP: NONE SEEN
YEAST WET PREP: NONE SEEN

## 2017-05-13 NOTE — MAU Provider Note (Signed)
History     CSN: [id number]  Arrival date and time: 05/13/17 1230   First Provider Initiated Contact with Patient 05/13/17 1310      Chief Complaint  Patient presents with  . Vaginal Bleeding   HPI Diamond Hunter is a 37 y.o. G2P0010 at [redacted]w[redacted]d who presents with vaginal bleeding. Normally sees Dr. Hyacinth Meeker for gyn care but recently transferred to Dr. Charlotta Newton for pregnancy. First appt with Dr. Charlotta Newton will be on Monday. Per patient & review of records, had ultrasound on 1/17 that showed an IUP measuring [redacted]w[redacted]d with embryo & FHR of 106 bpm.  Symptoms began last night. States she was bent over when she felt a sharp pain in her RLQ. Reports pain felt like when her ovarian cysts rupture. Pain was short lived & since then only feels lower abdominal aching which she states has been consistent throughout the pregnancy. This morning she noted pink spotting on toilet paper, then noticed dark red spotting & little clots the size of "tomato seeds". States spotting was brown when she came to MAU. Denies n/v/d, straining for BM, or recent intercourse.   OB History    Gravida Para Term Preterm AB Living   2 0 0 0 1 0   SAB TAB Ectopic Multiple Live Births   1 0 0 0 0      Past Medical History:  Diagnosis Date  . Migraine headache without aura 06/2010  . PCOS (polycystic ovarian syndrome)    suspected    Past Surgical History:  Procedure Laterality Date  . WISDOM TOOTH EXTRACTION  1995    Family History  Problem Relation Age of Onset  . Bladder Cancer Father 73       bladder cancer  . Heart failure Maternal Grandmother   . Diabetes Maternal Grandmother   . Heart failure Paternal Grandmother   . Hyperlipidemia Paternal Grandmother   . Hypertension Paternal Grandmother   . Lung cancer Paternal Grandfather        smoker  . Prostate cancer Paternal Grandfather     Social History   Tobacco Use  . Smoking status: Never Smoker  . Smokeless tobacco: Never Used  Substance Use Topics  . Alcohol  use: Yes    Alcohol/week: 0.6 oz    Types: 1 Standard drinks or equivalent per week  . Drug use: No    Allergies: No Known Allergies  Medications Prior to Admission  Medication Sig Dispense Refill Last Dose  . Prenatal Vit-Fe Fumarate-FA (MULTIVITAMIN-PRENATAL) 27-0.8 MG TABS tablet Take 1 tablet by mouth daily at 12 noon.   Taking    Review of Systems  Constitutional: Negative.   Gastrointestinal: Positive for abdominal pain (none currently). Negative for constipation, diarrhea, nausea and vomiting.  Genitourinary: Positive for vaginal bleeding. Negative for dyspareunia, dysuria and vaginal discharge.   Physical Exam   Blood pressure 118/89, pulse 81, temperature 98 F (36.7 C), temperature source Oral, resp. rate 18, last menstrual period 02/18/2017.  Physical Exam  Nursing note and vitals reviewed. Constitutional: She is oriented to person, place, and time. She appears well-developed and well-nourished. No distress.  HENT:  Head: Normocephalic and atraumatic.  Eyes: Conjunctivae are normal. Right eye exhibits no discharge. Left eye exhibits no discharge. No scleral icterus.  Neck: Normal range of motion.  Respiratory: Effort normal. No respiratory distress.  GI: Soft. There is no tenderness.  Genitourinary: Cervix exhibits friability. Cervix exhibits no motion tenderness.  Genitourinary Comments: Minimal amount of tan mucoid discharge from os.  No active bleeding. Cervix slightly friable. Cervix closed.   Neurological: She is alert and oriented to person, place, and time.  Skin: Skin is warm and dry. She is not diaphoretic.  Psychiatric: She has a normal mood and affect. Her behavior is normal. Judgment and thought content normal.    MAU Course  Procedures Results for orders placed or performed during the hospital encounter of 05/13/17 (from the past 24 hour(s))  Urinalysis, Routine w reflex microscopic     Status: Abnormal   Collection Time: 05/13/17 12:40 PM  Result  Value Ref Range   Color, Urine COLORLESS (A) YELLOW   APPearance CLEAR CLEAR   Specific Gravity, Urine 1.003 (L) 1.005 - 1.030   pH 6.0 5.0 - 8.0   Glucose, UA NEGATIVE NEGATIVE mg/dL   Hgb urine dipstick MODERATE (A) NEGATIVE   Bilirubin Urine NEGATIVE NEGATIVE   Ketones, ur 20 (A) NEGATIVE mg/dL   Protein, ur NEGATIVE NEGATIVE mg/dL   Nitrite NEGATIVE NEGATIVE   Leukocytes, UA NEGATIVE NEGATIVE   RBC / HPF 0-5 0 - 5 RBC/hpf   WBC, UA 0-5 0 - 5 WBC/hpf   Bacteria, UA RARE (A) NONE SEEN   Squamous Epithelial / LPF 0-5 (A) NONE SEEN   Mucus PRESENT   ABO/Rh     Status: None   Collection Time: 05/13/17  1:20 PM  Result Value Ref Range   ABO/RH(D)      A POS Performed at [facility], [address], [place], [place] [address]   CBC     Status: None   Collection Time: 05/13/17  1:20 PM  Result Value Ref Range   WBC 8.5 4.0 - 10.5 K/uL   RBC 4.05 3.87 - 5.11 MIL/uL   Hemoglobin 12.5 12.0 - 15.0 g/dL   HCT 19.1 47.8 - 29.5 %   MCV 92.3 78.0 - 100.0 fL   MCH 30.9 26.0 - 34.0 pg   MCHC 33.4 30.0 - 36.0 g/dL   RDW 62.1 30.8 - 65.7 %   Platelets 312 150 - 400 K/uL  Wet prep, genital     Status: Abnormal   Collection Time: 05/13/17  1:20 PM  Result Value Ref Range   Yeast Wet Prep HPF POC NONE SEEN NONE SEEN   Trich, Wet Prep NONE SEEN NONE SEEN   Clue Cells Wet Prep HPF POC NONE SEEN NONE SEEN   WBC, Wet Prep HPF POC FEW (A) NONE SEEN   Sperm NONE SEEN    US Ob Less Than 14 Weeks With Ob Transvaginal  Result Date: 05/13/2017 CLINICAL DATA:  Pregnant patient.  Spotting. EXAM: OBSTETRIC <14 WK [place] AND TRANSVAGINAL OB US TECHNIQUE: Both transabdominal and transvaginal ultrasound examinations were performed for complete evaluation of the gestation as well as the maternal uterus, adnexal regions, and pelvic cul-de-sac. Transvaginal technique was performed to assess early pregnancy. COMPARISON:  None. FINDINGS: Intrauterine gestational sac: There is an oval fluid collection in the  endometrium with a mean diameter of 2.4 cm. Yolk sac:  Possible subtle yolk sac.  Not definitive. Embryo:  Not Visualized. MSD: 24 mm   7 w   to d CRL:    mm    w    d                  [place] EDC: Subchorionic hemorrhage:  None visualized. Maternal uterus/adnexae: Bilateral corpus luteum cyst. No other abnormalities. IMPRESSION: 1. There is a probable gestational sac with a mean diameter of 24 mm  within the endometrial canal. There may be a subtle yolk sac but this is not definitive. No fetal pole identified. Findings are suspicious but not yet definitive for failed pregnancy. Recommend follow-up US in 10-14 days for definitive diagnosis. This recommendation follows SRU consensus guidelines: Diagnostic Criteria for Nonviable Pregnancy Early in the First Trimester. Malva Limes Engl J Med 2013; [id number]:1191-47; 369:1443-51. Electronically Signed   By: Gerome Samavid  Williams III M.D   On: 05/13/2017 14:04    MDM CBC, abo/rh, ultrasound On exam, friable cervix and minimal tan mucoid from os. Cervix closed Blood type A positive Ultrasound shows IUGS w/ ? YS, no embryo.  C/w Dr. Normand Sloopillard. Ok to discharge home. Patient to keep f/u on Monday.   Discussed results with patient. She states she hasn't seen Dr. Charlotta Newtonzan yet & would like to f/u with Dr. Hyacinth MeekerMiller as she has seen her before for previous miscarriage. Informed patient that I would send Dr. Hyacinth MeekerMiller a message in Epic but that she would need to call both offices on Monday morning to schedule/cancel appointments as necessary.   Assessment and Plan  A: 1. Missed abortion   2. Vaginal bleeding in pregnancy, first trimester    P: Discharge home Pelvic rest Discussed reasons to return to MAU Epic msg sent to Dr. Hyacinth MeekerMiller Pt knows to call Dr. Miller's/Dr. Lawana Chamberszan's offices on Monday morning regarding f/u appt  Judeth Hornrin Oswell Say 05/13/2017, 1:11 PM

## 2017-05-13 NOTE — MAU Note (Signed)
Pt presents to MAU with c/o light pink spotting that started today and lower abdominal discomfort that started last night.

## 2017-05-13 NOTE — Discharge Instructions (Signed)

## 2017-05-15 ENCOUNTER — Telehealth: Payer: Self-pay | Admitting: Obstetrics & Gynecology

## 2017-05-15 LAB — GC/CHLAMYDIA PROBE AMP (~~LOC~~) NOT AT ARMC
Chlamydia: NEGATIVE
NEISSERIA GONORRHEA: NEGATIVE

## 2017-05-15 NOTE — Telephone Encounter (Signed)
Patient miscarrying. [facility]. Last seen 04/06/17.

## 2017-05-15 NOTE — Telephone Encounter (Signed)
Spoke with patient. States she was seen in MAU on 05/13/17 for miscarriage, estimates gestation 7wks.   Patient reports increase in bleeding and pain on 2/24, passed several clots, bleeding started to slow mid day.  Taking 2 tylenol q 4-5 hrs for pain.   Bleeding has reduced today, wearing pad, "light flow". Pain 2/10. Denies any other symptoms.   Patient states she has cancelled all f/u with Dr. Charlotta Newtonzan as she had not been seen for her  first OB visit, felt it was better to f/u with Dr. Hyacinth MeekerMiller. Advised patient Dr. Hyacinth MeekerMiller is in surgery this morning, response may not be immediate, will f/u with Dr. Hyacinth MeekerMiller for recommendations and return call. Patient verbalizes understanding and is agreeable.   Dr. Hyacinth MeekerMiller -please review and advise on f/u?

## 2017-05-15 NOTE — Telephone Encounter (Signed)
Will need to follow down HCG levels.  Ok for appt later this week and then will need weekly HCGs until back to normal.  We will discuss the evaluation for recurrent pregnancy loss at OV as well.

## 2017-05-15 NOTE — Telephone Encounter (Signed)
Spoke with patient, advised as seen below per Dr. Hyacinth MeekerMiller. OV scheduled for 2/28 at 1:45pm with Dr. Hyacinth MeekerMiller. Patient aware to return call to office with any concerns/questions.   Routing to provider for final review. Patient is agreeable to disposition. Will close encounter.

## 2017-05-18 ENCOUNTER — Ambulatory Visit: Payer: BLUE CROSS/BLUE SHIELD | Admitting: Obstetrics & Gynecology

## 2017-05-18 ENCOUNTER — Encounter: Payer: Self-pay | Admitting: Obstetrics & Gynecology

## 2017-05-18 VITALS — BP 134/60 | HR 84 | Resp 16 | Wt 197.2 lb

## 2017-05-18 DIAGNOSIS — N96 Recurrent pregnancy loss: Secondary | ICD-10-CM | POA: Diagnosis not present

## 2017-05-18 NOTE — Progress Notes (Signed)
GYNECOLOGY  VISIT  CC:   Follow up from MAU  HPI: 37 y.o. [id number] Married Caucasian female here for follow up after being seen in the maternity admissions unit on 06/10/2017 with vaginal bleeding.  Ultrasound did not show viable IUP.  Patient found to have a heavier cycle that has now tapered off.  She is still actively bleeding.  It is light however.  She denies any pain or fever.  This is her second miscarriage.  Recommended follow-up after miscarriage discussed.  Also evaluation for recurrent pregnancy loss discussed.  She does not want to do anything today except to have her hCG done but does not want to discuss evaluation with her husband.  Infertility consultation also offered.  She is not sure she wants to proceed with this at this time.  She is going to start a baby aspirin and take this with her prenatal vitamins.  GYNECOLOGIC HISTORY: Patient's last menstrual period was 05/13/2017. Contraception: none Menopausal hormone therapy: none  Patient Active Problem List   Diagnosis Date Noted  . DUB (dysfunctional uterine bleeding) 02/16/2016  . PCOS (polycystic ovarian syndrome) 02/16/2016  . Oligo-ovulation 02/16/2016  . PATELLO-FEMORAL SYNDROME 01/11/2008  . METATARSALGIA 01/11/2008  . UNEQUAL LEG LENGTH 01/11/2008    Past Medical History:  Diagnosis Date  . Migraine headache without aura 06/2010  . PCOS (polycystic ovarian syndrome)    suspected    Past Surgical History:  Procedure Laterality Date  . WISDOM TOOTH EXTRACTION  1995    MEDS:   No current outpatient medications on file prior to visit.   No current facility-administered medications on file prior to visit.     ALLERGIES: Patient has no known allergies.  Family History  Problem Relation Age of Onset  . Bladder Cancer Father 2655       bladder cancer  . Heart failure Maternal Grandmother   . Diabetes Maternal Grandmother   . Heart failure Paternal Grandmother   . Hyperlipidemia Paternal Grandmother   .  Hypertension Paternal Grandmother   . Lung cancer Paternal Grandfather        smoker  . Prostate cancer Paternal Grandfather     SH: Married, non-smoker  Review of Systems  All other systems reviewed and are negative.   PHYSICAL EXAMINATION:    BP 134/60 (BP Location: Right Arm, Patient Position: Sitting, Cuff Size: Large)   Pulse 84   Resp 16   Wt 197 lb 3.2 oz (89.4 kg)   LMP 05/13/2017   Breastfeeding? Unknown   BMI 33.85 kg/m     General appearance: alert, cooperative and appears stated age Abdomen: soft, non-tender; bowel sounds normal; no masses,  no organomegaly  Pelvic: External genitalia:  no lesions              Urethra:  normal appearing urethra with no masses, tenderness or lesions              Bartholins and Skenes: normal                 Vagina: normal appearing vagina with normal color and discharge, no lesions              Cervix: no lesions, appears closed               Bimanual Exam:  Uterus:  normal size, contour, position, consistency, mobility, non-tender              Adnexa: no mass, fullness, tenderness   Chaperone was present  for exam.  Assessment: Second miscarriage  Rh+--blood type a positive  Plan: HCG level obtained today.  Will follow exam weekly to Bactrim.  If level tapered off meantime, we will obtain ultrasound. Evaluation for recurrent pregnancy loss discussed.  She also discussed with her husband and then she will let me know her decision is.  Would highly recommend she proceed with evaluation at this time. Continue prenatal vitamins start a baby aspirin, 81 mg.   ~20 minutes spent with patient >50% of time was in face to face discussion of above.

## 2017-05-18 NOTE — Patient Instructions (Addendum)
Chromosomal testing--karyotype Anticardiolipin antibody and lupus anticoagulant Fertility specialist referral??  Ok to start a baby aspirin daily when you are ready

## 2017-05-19 LAB — BETA HCG QUANT (REF LAB): hCG Quant: 527 m[IU]/mL

## 2017-05-22 ENCOUNTER — Other Ambulatory Visit: Payer: Self-pay | Admitting: *Deleted

## 2017-05-22 DIAGNOSIS — Z8759 Personal history of other complications of pregnancy, childbirth and the puerperium: Secondary | ICD-10-CM

## 2017-05-23 ENCOUNTER — Other Ambulatory Visit: Payer: Self-pay | Admitting: Obstetrics & Gynecology

## 2017-05-23 DIAGNOSIS — O039 Complete or unspecified spontaneous abortion without complication: Secondary | ICD-10-CM

## 2017-05-30 ENCOUNTER — Telehealth: Payer: Self-pay

## 2017-05-30 ENCOUNTER — Other Ambulatory Visit: Payer: Self-pay | Admitting: Obstetrics & Gynecology

## 2017-05-30 DIAGNOSIS — Z8759 Personal history of other complications of pregnancy, childbirth and the puerperium: Secondary | ICD-10-CM | POA: Diagnosis not present

## 2017-05-30 DIAGNOSIS — O039 Complete or unspecified spontaneous abortion without complication: Secondary | ICD-10-CM

## 2017-05-30 NOTE — Telephone Encounter (Signed)
-----   Message from Jerene BearsMary S Miller, MD sent at 05/29/2017  8:05 PM EDT ----- Regarding: follow up quant HCG after SAB Pt was to go for repeat HCG last week and I have not gotten a result.  Will need to follow HCG levels to normal.  Can you call and see if she when and, if not, when she will go this week?  I will need to place a new order.  Thanks.  Rosalita ChessmanSuzanne

## 2017-05-30 NOTE — Telephone Encounter (Signed)
Spoke with patient. Patient did not go for follow up lab work. States she is going to go today on her way home from work. Will call the office to let us know when this is complete so Dr.Miller will be aware of when to expect results.

## 2017-05-30 NOTE — Telephone Encounter (Signed)
Order placed again today.  Ok to close encounter.

## 2017-05-30 NOTE — Progress Notes (Signed)
.  hac

## 2017-05-31 LAB — BETA HCG QUANT (REF LAB): HCG QUANT: 15 m[IU]/mL

## 2017-06-01 ENCOUNTER — Other Ambulatory Visit: Payer: Self-pay | Admitting: Obstetrics & Gynecology

## 2017-06-01 ENCOUNTER — Telehealth: Payer: Self-pay | Admitting: Obstetrics & Gynecology

## 2017-06-01 DIAGNOSIS — O039 Complete or unspecified spontaneous abortion without complication: Secondary | ICD-10-CM

## 2017-06-01 NOTE — Telephone Encounter (Signed)
Spoke with patient. Results given. Patient verbalizes understanding and will go to outside lab in 1 week for recheck. Will contact the office once labs have been drawn so that we know when results should come in. Encounter closed.  Notes recorded by Jerene BearsMiller, Mary S, MD on 06/01/2017 at 6:42 AM EDT Please let pt know this is 15. Will need to repeat in one week. Order has been placed to do at outside lab. Thanks.

## 2017-06-01 NOTE — Telephone Encounter (Signed)
Patient was told to call and left Dr.Miller know when she had her labs drawn at [facility]. Patient had these labs drawn 05/30/17 in the afternoon. No need to return call to the patient.

## 2017-06-06 ENCOUNTER — Other Ambulatory Visit: Payer: Self-pay | Admitting: Obstetrics & Gynecology

## 2017-06-06 DIAGNOSIS — Z8759 Personal history of other complications of pregnancy, childbirth and the puerperium: Secondary | ICD-10-CM | POA: Diagnosis not present

## 2017-06-07 LAB — BETA HCG QUANT (REF LAB): hCG Quant: 7 m[IU]/mL

## 2017-06-08 ENCOUNTER — Other Ambulatory Visit: Payer: Self-pay | Admitting: Obstetrics & Gynecology

## 2017-06-08 ENCOUNTER — Telehealth: Payer: Self-pay

## 2017-06-08 DIAGNOSIS — O039 Complete or unspecified spontaneous abortion without complication: Secondary | ICD-10-CM

## 2017-06-08 NOTE — Telephone Encounter (Signed)
Spoke with patient. Advised of message as seen below from Dr.Jertson. 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 Also is having trouble with having this checked weekly emotionally. Asking if there are any health risks for her waiting a couple of weeks to have this done when she has more time. Advised will review with Dr.Miller and return call.

## 2017-06-08 NOTE — Telephone Encounter (Signed)
It is ok to do in two weeks.  I will change the order for the HCG level as it was entered for next week. Thanks.

## 2017-06-08 NOTE — Telephone Encounter (Signed)
-----   Message from Romualdo BolkJill Evelyn Jertson, MD sent at 06/07/2017  6:41 PM EDT ----- Please inform, check in one week (hopefully the last one)

## 2017-06-09 NOTE — Telephone Encounter (Signed)
Left detailed message at number provided [phone], okay per ROI. Advised of message as seen below from Dr.Miller. Advised to contact the office on the day she has her labs drawn so we can ensure we receive results. Encounter closed.

## 2017-06-13 ENCOUNTER — Other Ambulatory Visit: Payer: Self-pay | Admitting: Obstetrics & Gynecology

## 2017-06-13 DIAGNOSIS — O039 Complete or unspecified spontaneous abortion without complication: Secondary | ICD-10-CM

## 2017-06-28 ENCOUNTER — Telehealth: Payer: Self-pay | Admitting: Obstetrics & Gynecology

## 2017-06-28 DIAGNOSIS — N96 Recurrent pregnancy loss: Secondary | ICD-10-CM

## 2017-06-28 NOTE — Telephone Encounter (Signed)
Yes, she needs to have the hCG tested again.  This needs to be followed down to a negative value.  Please make a lab appointment.   Cc- Dr. Hyacinth MeekerMiller

## 2017-06-28 NOTE — Telephone Encounter (Signed)
Patient called requesting to speak with the nurse. She said she started her menstrual cycle 06/20/17. She said she is now "post miscarriage" and wants to know if she still needs her HCG tested.  Last seen: 05/18/17

## 2017-06-28 NOTE — Telephone Encounter (Signed)
Left message to call Kaitlyn at [phone]. 

## 2017-06-28 NOTE — Telephone Encounter (Signed)
Spoke with patient. Patient's HCG levels were being followed for pregnancy loss. Last HCG was 7 on 06/06/2017. Was advised to have this retested 1 week later. Patient was unable to at that time. Patient started menses on 4/2-4/8. Reports cycle was lighter than usual with some clots. Denies heavy bleeding. Asking if she still needs to have HCG level retested.

## 2017-07-11 DIAGNOSIS — F4321 Adjustment disorder with depressed mood: Secondary | ICD-10-CM | POA: Diagnosis not present

## 2017-07-13 NOTE — Telephone Encounter (Signed)
Left message to call Kaitlyn at [phone]. 

## 2017-07-17 NOTE — Telephone Encounter (Signed)
Left detailed message at number provided [phone], okay per ROI. Advised order for HCG quant has been placed to outside lab facility. Advised to contact the office once this has been performed so that we are aware of when to expect the results.

## 2017-07-17 NOTE — Telephone Encounter (Signed)
Patient called and requested an order for HCG be entered into the [facility] system so she can have it done in [place].

## 2017-07-19 DIAGNOSIS — N96 Recurrent pregnancy loss: Secondary | ICD-10-CM | POA: Diagnosis not present

## 2017-07-20 LAB — BETA HCG QUANT (REF LAB): hCG Quant: <1 m[IU]/mL

## 2017-07-20 NOTE — Telephone Encounter (Signed)
Patient called to let us know that she had her lab work completed yesterday.

## 2017-07-20 NOTE — Telephone Encounter (Signed)
Left detailed message with results, okay per ROI. Advised to return call with any additional questions. Encounter closed.  Notes recorded by Jerene Bears, MD on 07/20/2017 at 8:42 AM EDT Please let pt know this is negative and thank her for going back to finish this testing.

## 2017-07-24 DIAGNOSIS — F4321 Adjustment disorder with depressed mood: Secondary | ICD-10-CM | POA: Diagnosis not present

## 2017-08-04 DIAGNOSIS — F4321 Adjustment disorder with depressed mood: Secondary | ICD-10-CM | POA: Diagnosis not present

## 2017-08-17 ENCOUNTER — Ambulatory Visit: Payer: BLUE CROSS/BLUE SHIELD | Admitting: Obstetrics & Gynecology

## 2017-08-17 ENCOUNTER — Encounter: Payer: Self-pay | Admitting: Obstetrics & Gynecology

## 2017-08-17 VITALS — BP 116/88 | HR 80 | Resp 16 | Ht 64.0 in | Wt 189.6 lb

## 2017-08-17 DIAGNOSIS — N96 Recurrent pregnancy loss: Secondary | ICD-10-CM | POA: Diagnosis not present

## 2017-08-17 DIAGNOSIS — Z3009 Encounter for other general counseling and advice on contraception: Secondary | ICD-10-CM | POA: Diagnosis not present

## 2017-08-17 NOTE — Progress Notes (Signed)
Patient ID: Diamond Hunter, female   DOB: 11/14/1980, 37 y.o.   MRN: [id number]  GYNECOLOGY  VISIT  CC:   Discuss contraception and recurrent pregnancy loss  HPI: 37 y.o. G63P0020 Married Caucasian female here for discussion of contraception options.  She's had two miscarriages after Femara use for attempted pregnancy.  She has not done any additional evaluation for recurrent pregnancy loss, although we have discussed this in the past.  Her husband is not really sure that this is needed if they are resigned to not proceed with pregnancy.  She's not quite at that place yet.  Referral to REI has also been discsused but she is not desirous of this at this time.  Currently they are foster parents for a 81 month old child that they've had since birth who will likely be placed with the maternal grandmother in [place] and recently a 27 year old girl.  This is making their lives very full.  She would like contraception that is easily reversible, with minimal side effects.  OCPs, POPs, Nuva ring, Progesterone and copper IUDs, Depo Provera, and Nexplanon reviewed.  She wants something that is least likely to cause weight gain.  Feel Rutha Bouchard would be a good option for her, even if she ultimately decides to try for pregnancy again.    Also, recurrent pregnancy loss w/u discussed.  She does not want to do chromosomal testing at this time but is ready to proceed with LAC and antiphospholipid antibody testing.  Will have this done today.  GYNECOLOGIC HISTORY: Patient's last menstrual period was 07/15/2017.  Patient Active Problem List   Diagnosis Date Noted  . DUB (dysfunctional uterine bleeding) 02/16/2016  . PCOS (polycystic ovarian syndrome) 02/16/2016  . Oligo-ovulation 02/16/2016  . PATELLO-FEMORAL SYNDROME 01/11/2008  . METATARSALGIA 01/11/2008  . UNEQUAL LEG LENGTH 01/11/2008    Past Medical History:  Diagnosis Date  . Migraine headache without aura 06/2010  . PCOS (polycystic ovarian syndrome)    suspected    Past Surgical History:  Procedure Laterality Date  . WISDOM TOOTH EXTRACTION  1995    MEDS:   No current outpatient medications on file prior to visit.   No current facility-administered medications on file prior to visit.     ALLERGIES: Patient has no known allergies.  Family History  Problem Relation Age of Onset  . Bladder Cancer Father 40       bladder cancer  . Heart failure Maternal Grandmother   . Diabetes Maternal Grandmother   . Heart failure Paternal Grandmother   . Hyperlipidemia Paternal Grandmother   . Hypertension Paternal Grandmother   . Lung cancer Paternal Grandfather        smoker  . Prostate cancer Paternal Grandfather     SH:  Married, non smoker  Review of Systems  All other systems reviewed and are negative.   PHYSICAL EXAMINATION:    BP 116/88 (BP Location: Left Arm, Patient Position: Sitting, Cuff Size: Large)   Pulse 80   Resp 16   Ht  (1.626 m)   Wt 189 lb 9.6 oz (86 kg)   LMP 07/15/2017   BMI 32.54 kg/m     General appearance: alert, cooperative and appears stated age No other exam performed today  Assessment: Contraception options reviewed.  She is going to check into coverage for IUD from her insurance company.  Information given.  If desires this, she will call with onset of next cycle for placement.  If decides for option that needs prescription,  she will let me know Recurrent pregnancy loss  Plan: AMH Lupus anticoagulant and antiphospholipid antibody panel obtained TSH    ~30 minutes spent with patient >50% of time was in face to face discussion of above.

## 2017-08-20 LAB — ANTIPHOSPHOLIPID SYNDROME EVAL, BLD
APTT PPP: 27.2 s (ref 22.9–30.2)
Anticardiolipin IgG: <9 GPL U/mL (ref 0–14)
Anticardiolipin IgM: <9 MPL U/mL (ref 0–12)
HEXAGONAL PHASE PHOSPHOLIPID: 0 s (ref 0–11)
INR: 0.9 (ref 0.9–1.1)
PT: 10.2 s (ref 9.6–11.5)
Thrombin Time: 17.1 s (ref 0.0–23.0)

## 2017-08-20 LAB — TSH: TSH: 1.4 u[IU]/mL (ref 0.450–4.500)

## 2017-08-20 LAB — ANTI MULLERIAN HORMONE: ANTI-MULLERIAN HORMONE (AMH): 12 ng/mL

## 2017-08-20 LAB — COAG STUDIES INTERP REPORT

## 2017-08-20 LAB — LUPUS ANTICOAGULANT PANEL
DRVVT: 33.8 s (ref 0.0–47.0)
PTT LA: 35.8 s (ref 0.0–51.9)

## 2017-08-22 ENCOUNTER — Telehealth: Payer: Self-pay | Admitting: Obstetrics & Gynecology

## 2017-08-22 NOTE — Telephone Encounter (Signed)
Patient returning Kaitlyn's call. °

## 2017-08-22 NOTE — Telephone Encounter (Signed)
Patient has some questions about her lab results.  

## 2017-08-22 NOTE — Telephone Encounter (Signed)
She still has ovarian reserve.  If elevated with PCOS is typically 3-4X's higher than without PCOS.  Ok to proceed with genetic testing if she wants.  I think I can order this for her to do at her local [facility].

## 2017-08-22 NOTE — Telephone Encounter (Signed)
Spoke with patient. Patient would like to know the level of her AMH. Advised AMH level is 12. Patient states that she knows she has had PCOS, but is trying to make the best decision regarding trying for pregnancy. Unclear if results showed if trying again is okay or that it is no longer the ideal time to try. Also interested in genetic testing if needed. Advised will review with Dr.Miller and return call.

## 2017-08-22 NOTE — Telephone Encounter (Signed)
Left message to call Johnn Krasowski at [phone]. 

## 2017-08-23 NOTE — Telephone Encounter (Signed)
Spoke with patient. Advised of message as seen below from Dr.Miller. Patient verbalizes understanding. Would like to speak with her spouse regarding genetic testing. Will return call if she would like to proceed. Encounter closed.

## 2017-08-24 DIAGNOSIS — F4321 Adjustment disorder with depressed mood: Secondary | ICD-10-CM | POA: Diagnosis not present

## 2017-09-08 DIAGNOSIS — F4321 Adjustment disorder with depressed mood: Secondary | ICD-10-CM | POA: Diagnosis not present

## 2017-09-29 DIAGNOSIS — F4321 Adjustment disorder with depressed mood: Secondary | ICD-10-CM | POA: Diagnosis not present

## 2017-11-07 DIAGNOSIS — F4321 Adjustment disorder with depressed mood: Secondary | ICD-10-CM | POA: Diagnosis not present

## 2017-11-21 ENCOUNTER — Ambulatory Visit: Payer: BLUE CROSS/BLUE SHIELD | Admitting: Obstetrics & Gynecology

## 2017-11-21 DIAGNOSIS — F4321 Adjustment disorder with depressed mood: Secondary | ICD-10-CM | POA: Diagnosis not present

## 2017-12-18 DIAGNOSIS — F4321 Adjustment disorder with depressed mood: Secondary | ICD-10-CM | POA: Diagnosis not present

## 2017-12-21 ENCOUNTER — Other Ambulatory Visit: Payer: Self-pay

## 2017-12-21 ENCOUNTER — Encounter: Payer: Self-pay | Admitting: Obstetrics & Gynecology

## 2017-12-21 ENCOUNTER — Other Ambulatory Visit (HOSPITAL_COMMUNITY)
Admission: RE | Admit: 2017-12-21 | Discharge: 2017-12-21 | Disposition: A | Discharge status: Home / Self Care | Payer: BLUE CROSS/BLUE SHIELD | Source: Ambulatory Visit | Attending: Obstetrics & Gynecology | Admitting: Obstetrics & Gynecology

## 2017-12-21 ENCOUNTER — Ambulatory Visit: Payer: BLUE CROSS/BLUE SHIELD | Admitting: Obstetrics & Gynecology

## 2017-12-21 VITALS — BP 110/70 | HR 72 | Resp 16 | Ht 64.25 in | Wt 197.4 lb

## 2017-12-21 DIAGNOSIS — Z0000 Encounter for general adult medical examination without abnormal findings: Secondary | ICD-10-CM | POA: Diagnosis not present

## 2017-12-21 DIAGNOSIS — Z124 Encounter for screening for malignant neoplasm of cervix: Secondary | ICD-10-CM | POA: Insufficient documentation

## 2017-12-21 DIAGNOSIS — Z01419 Encounter for gynecological examination (general) (routine) without abnormal findings: Secondary | ICD-10-CM

## 2017-12-21 DIAGNOSIS — N926 Irregular menstruation, unspecified: Secondary | ICD-10-CM | POA: Diagnosis not present

## 2017-12-21 DIAGNOSIS — E559 Vitamin D deficiency, unspecified: Secondary | ICD-10-CM | POA: Diagnosis not present

## 2017-12-21 MED ORDER — METFORMIN HCL 500 MG PO TABS: ORAL_TABLET | ORAL | 4 refills | Status: DC

## 2017-12-21 NOTE — Progress Notes (Signed)
37 y.o. [id number] Married White or Caucasian female here for annual exam.  Diamond Hunter daughter, Mora Bellman, was taken by the courts and she is now with her grandmother in [place].  Has a 46 yo foster child, Raynelle Fanning, who is living with them.  She is a [employer] representative.  This has caused a lot of stressors.   Cycles are a little irregular.  Cycles are about 45 days.  She will have a day of spotting and then stops for two to three days.  She then starts her real flow which is very heavy.  She is feeling more moody with her cycles.   Not interested in any fertility treatment at this point.  Does not want to be on OCPs either.  States if she got pregnant, that would be just fine.             Sexually active: Yes.    The current method of family planning is none.    Exercising: No.   Smoker:  no  Health Maintenance: Pap:  11/04/14 Neg. HR HPV: Neg  History of abnormal Pap:  no MMG:  Never TDaP:  2011 Screening Labs: if needed    reports that she has never smoked. She has never used smokeless tobacco. She reports that she drinks about 5.0 standard drinks of alcohol per week. She reports that she does not use drugs.  Past Medical History:  Diagnosis Date  . Migraine headache without aura 06/2010  . PCOS (polycystic ovarian syndrome)    suspected    Past Surgical History:  Procedure Laterality Date  . WISDOM TOOTH EXTRACTION  1995    No current outpatient medications on file.   No current facility-administered medications for this visit.     Family History  Problem Relation Age of Onset  . Bladder Cancer Father 70       bladder cancer  . Heart failure Maternal Grandmother   . Diabetes Maternal Grandmother   . Heart failure Paternal Grandmother   . Hyperlipidemia Paternal Grandmother   . Hypertension Paternal Grandmother   . Lung cancer Paternal Grandfather        smoker  . Prostate cancer Paternal Grandfather     Review of Systems  Genitourinary: Positive for dyspareunia, frequency and menstrual  problem.  All other systems reviewed and are negative.   Exam:   BP 110/70 (BP Location: Right Arm, Patient Position: Sitting, Cuff Size: Large)   Pulse 72   Resp 16   Ht 5' 4.25" (1.632 m)   Wt 197 lb 6.4 oz (89.5 kg)   LMP 02/18/2017   BMI 33.62 kg/m     Height: 5' 4.25" (163.2 cm)  Ht Readings from Last 3 Encounters:  12/21/17 5' 4.25" (1.632 m)  08/17/17 5\' 4"  (1.626 m)  04/06/17 5\' 4"  (1.626 m)    General appearance: alert, cooperative and appears stated age Head: Normocephalic, without obvious abnormality, atraumatic Neck: no adenopathy, supple, symmetrical, trachea midline and thyroid normal to inspection and palpation Lungs: clear to auscultation bilaterally Breasts: normal appearance, no masses or tenderness Heart: regular rate and rhythm Abdomen: soft, non-tender; bowel sounds normal; no masses,  no organomegaly Extremities: extremities normal, atraumatic, no cyanosis or edema Skin: Skin color, texture, turgor normal. No rashes or lesions Lymph nodes: Cervical, supraclavicular, and axillary nodes normal. No abnormal inguinal nodes palpated Neurologic: Grossly normal   Pelvic: External genitalia:  no lesions              Urethra:  normal appearing urethra  with no masses, tenderness or lesions              Bartholins and Skenes: normal                 Vagina: normal appearing vagina with normal color and discharge, no lesions              Cervix: no lesions              Pap taken: Yes.   Bimanual Exam:  Uterus:  normal size, contour, position, consistency, mobility, non-tender              Adnexa: normal adnexa and no mass, fullness, tenderness               Rectovaginal: Confirms               Anus:  normal sphincter tone, no lesions  Chaperone was present for exam.  A:  Well Woman with normal exam H/O recurrent pregnancy loss PCOS Irregular cycles  P:   Mammogram guidelines reviewed pap smear with HR HPV obtained today Will obtain screening blood work  today:  TSH, Vit D, Lipids, CMP, CBC return annually or prn

## 2017-12-22 LAB — CBC
HEMATOCRIT: 40.9 % (ref 34.0–46.6)
HEMOGLOBIN: 13.3 g/dL (ref 11.1–15.9)
MCH: 31.4 pg (ref 26.6–33.0)
MCHC: 32.5 g/dL (ref 31.5–35.7)
MCV: 97 fL (ref 79–97)
Platelets: 284 10*3/uL (ref 150–450)
RBC: 4.24 x10E6/uL (ref 3.77–5.28)
RDW: 13.8 % (ref 12.3–15.4)
WBC: 6.8 10*3/uL (ref 3.4–10.8)

## 2017-12-22 LAB — COMPREHENSIVE METABOLIC PANEL
A/G RATIO: 2.4 — AB (ref 1.2–2.2)
ALBUMIN: 4.5 g/dL (ref 3.5–5.5)
ALT: 12 IU/L (ref 0–32)
AST: 15 IU/L (ref 0–40)
Alkaline Phosphatase: 29 IU/L — ABNORMAL LOW (ref 39–117)
BILIRUBIN TOTAL: 0.3 mg/dL (ref 0.0–1.2)
BUN / CREAT RATIO: 19 (ref 9–23)
BUN: 11 mg/dL (ref 6–20)
CO2: 26 mmol/L (ref 20–29)
CREATININE: 0.58 mg/dL (ref 0.57–1.00)
Calcium: 9.7 mg/dL (ref 8.7–10.2)
Chloride: 102 mmol/L (ref 96–106)
GFR calc Af Amer: 135 mL/min/{1.73_m2} (ref >59)
GFR calc non Af Amer: 117 mL/min/{1.73_m2} (ref >59)
GLOBULIN, TOTAL: 1.9 g/dL (ref 1.5–4.5)
Glucose: 85 mg/dL (ref 65–99)
Potassium: 4.6 mmol/L (ref 3.5–5.2)
SODIUM: 142 mmol/L (ref 134–144)
Total Protein: 6.4 g/dL (ref 6.0–8.5)

## 2017-12-22 LAB — VITAMIN D 25 HYDROXY (VIT D DEFICIENCY, FRACTURES): VIT D 25 HYDROXY: 25.5 ng/mL — AB (ref 30.0–100.0)

## 2017-12-22 LAB — CYTOLOGY - PAP
Diagnosis: NEGATIVE
HPV: NOT DETECTED

## 2017-12-22 LAB — LIPID PANEL
CHOLESTEROL TOTAL: 190 mg/dL (ref 100–199)
Chol/HDL Ratio: 2.2 ratio (ref 0.0–4.4)
HDL: 85 mg/dL (ref >39)
LDL CALC: 95 mg/dL (ref 0–99)
Triglycerides: 52 mg/dL (ref 0–149)
VLDL Cholesterol Cal: 10 mg/dL (ref 5–40)

## 2017-12-22 LAB — TSH: TSH: 1.81 u[IU]/mL (ref 0.450–4.500)

## 2018-01-18 ENCOUNTER — Ambulatory Visit: Payer: BLUE CROSS/BLUE SHIELD | Admitting: Obstetrics & Gynecology

## 2018-01-19 DIAGNOSIS — F4321 Adjustment disorder with depressed mood: Secondary | ICD-10-CM | POA: Diagnosis not present

## 2018-02-23 DIAGNOSIS — F4321 Adjustment disorder with depressed mood: Secondary | ICD-10-CM | POA: Diagnosis not present

## 2018-03-23 DIAGNOSIS — F4321 Adjustment disorder with depressed mood: Secondary | ICD-10-CM | POA: Diagnosis not present

## 2018-04-02 DIAGNOSIS — N3001 Acute cystitis with hematuria: Secondary | ICD-10-CM | POA: Diagnosis not present

## 2018-05-18 DIAGNOSIS — H5213 Myopia, bilateral: Secondary | ICD-10-CM | POA: Diagnosis not present

## 2018-05-25 DIAGNOSIS — Z6835 Body mass index (BMI) 35.0-35.9, adult: Secondary | ICD-10-CM | POA: Diagnosis not present

## 2018-05-25 DIAGNOSIS — N390 Urinary tract infection, site not specified: Secondary | ICD-10-CM | POA: Diagnosis not present

## 2018-06-06 ENCOUNTER — Telehealth: Payer: Self-pay | Admitting: Obstetrics & Gynecology

## 2018-06-06 DIAGNOSIS — Z3009 Encounter for other general counseling and advice on contraception: Secondary | ICD-10-CM

## 2018-06-06 NOTE — Telephone Encounter (Signed)
Left message to call Noreene Larsson, RN at [facility] [phone].    Last AEX 12/21/17 Per review of Epic, discussed IUD at OV 08/17/17, call with menses to schedule.

## 2018-06-06 NOTE — Telephone Encounter (Signed)
Patient states she is returning a call to [place].

## 2018-06-06 NOTE — Telephone Encounter (Signed)
Spoke with patient. Patient requesting to schedule [name] IUD insertion. States she discussed this with Dr. Hyacinth Meeker at her AEX 12/21/17. LMP 06/06/18. Per review of OV notes dated 08/17/17, IUD discussed, call with menses for insertion.   IUD insertion scheduled for 3/20 at 10am with Dr. Hyacinth Meeker. Advised I will review with Dr. Hyacinth Meeker on 3/19 and return call with any additional recommendations. Order placed for precert. Patient verbalizes understanding.   Dr. Hyacinth Meeker -ok to proceed with IUD insertion?   Cc: Soundra Pilon

## 2018-06-06 NOTE — Telephone Encounter (Signed)
Patient wants to schedule an appointment for [facility] insertion. She states she talked with Dr. Hyacinth Meeker at her last visit about this. No order on file. She is on her cycle now.

## 2018-06-08 ENCOUNTER — Other Ambulatory Visit: Payer: Self-pay

## 2018-06-08 ENCOUNTER — Encounter: Payer: Self-pay | Admitting: Obstetrics & Gynecology

## 2018-06-08 ENCOUNTER — Ambulatory Visit: Payer: BLUE CROSS/BLUE SHIELD | Admitting: Obstetrics & Gynecology

## 2018-06-08 DIAGNOSIS — Z3009 Encounter for other general counseling and advice on contraception: Secondary | ICD-10-CM | POA: Diagnosis not present

## 2018-06-08 DIAGNOSIS — Z01812 Encounter for preprocedural laboratory examination: Secondary | ICD-10-CM

## 2018-06-08 LAB — POCT URINE PREGNANCY: Preg Test, Ur: NEGATIVE

## 2018-06-08 NOTE — Telephone Encounter (Signed)
Yes.  Thanks.  Encounter closed.

## 2018-06-08 NOTE — Progress Notes (Signed)
GYNECOLOGY  VISIT  CC:   [name] insertion   HPI: 39 y.o. G73P0020 Married White or Caucasian female here for Diamond Hunter insertion.  Has decided to have more permanent contraception placed at this time.  LMP 06/06/2018.  Has Diamond Hunter who has been doing so well.  She is going to got to [employer] this fall.  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    She and her husband have decided that they do not want to proceed with pregnancy at this point.    Also has Diamond Hunter who is a 40 year old boy who came to live with them in October.    GYNECOLOGIC HISTORY: Patient's last menstrual period was 06/06/2018 (exact date). Contraception: None Menopausal hormone therapy: none  Patient Active Problem List   Diagnosis Date Noted  . DUB (dysfunctional uterine bleeding) 02/16/2016  . PCOS (polycystic ovarian syndrome) 02/16/2016  . Oligo-ovulation 02/16/2016  . PATELLO-FEMORAL SYNDROME 01/11/2008  . METATARSALGIA 01/11/2008  . UNEQUAL LEG LENGTH 01/11/2008    Past Medical History:  Diagnosis Date  . Migraine headache without aura 06/2010  . PCOS (polycystic ovarian syndrome)    suspected    Past Surgical History:  Procedure Laterality Date  . WISDOM TOOTH EXTRACTION  1995    MEDS:   Current Outpatient Medications on File Prior to Visit  Medication Sig Dispense Refill  . cholecalciferol (VITAMIN D3) 25 MCG (1000 UT) tablet Take 1,000 Units by mouth daily.    . Zinc 10 MG LOZG Use as directed in the mouth or throat.    . metFORMIN (GLUCOPHAGE) 500 MG tablet 1 tab bid (Patient not taking: Reported on 06/08/2018) 180 tablet 4   No current facility-administered medications on file prior to visit.     ALLERGIES: Patient has no known allergies.  Family History  Problem Relation Age of Onset  . Bladder Cancer Father 53       bladder cancer  . Heart failure Maternal Grandmother   . Diabetes Maternal Grandmother   . Heart failure Paternal Grandmother   .  Hyperlipidemia Paternal Grandmother   . Hypertension Paternal Grandmother   . Lung cancer Paternal Grandfather        smoker  . Prostate cancer Paternal Grandfather     SH:  Married, non-smoker  Review of Systems  Constitutional:       Weight gain   Genitourinary: Positive for menstrual problem.       Breast pain   All other systems reviewed and are negative.   PHYSICAL EXAMINATION:    BP 122/80 (BP Location: Left Arm, Patient Position: Sitting, Cuff Size: Large)   Pulse 60   Resp 16   Ht 5' 4.25" (1.632 m)   Wt 205 lb (93 kg)   LMP 06/06/2018 (Exact Date)   BMI 34.91 kg/m     General appearance: alert, cooperative and appears stated age Lymph:  no inguinal LAD noted  Pelvic: External genitalia:  no lesions              Urethra:  normal appearing urethra with no masses, tenderness or lesions              Bartholins and Skenes: normal                 Vagina: normal appearing vagina with normal color and discharge, no lesions              Cervix: no lesions  Bimanual Exam:  Uterus:  normal size, contour, position, consistency, mobility, non-tender           Procedure: Speculum placed.  Cervix cleansed with Betadine x 3.  Single toothed tenaculum placed on anterior lip of cervix.  Uterus sounded to 7cm.  [name] IUD and introducer passed through the cervical canal and into the uterine cavity.  IUD and introducer removed about 1cm and then IUD placed inside the uterine cavity.  Introducer removed.  Strings cut to 2cm.  Tenaculum removed.  Minimal bleeding noted.  Pt tolerated procedure well.  Chaperone was present for exam.  Assessment: Placement of [name] IUD  Plan: Return in 2 months for recheck.

## 2018-07-12 DIAGNOSIS — F4321 Adjustment disorder with depressed mood: Secondary | ICD-10-CM | POA: Diagnosis not present

## 2018-07-19 ENCOUNTER — Telehealth: Payer: Self-pay | Admitting: Obstetrics & Gynecology

## 2018-07-19 NOTE — Telephone Encounter (Signed)
Patient canceled her 8-10 week recheck 08/03/18 and will call later to reschedule.

## 2018-08-03 ENCOUNTER — Ambulatory Visit: Payer: BLUE CROSS/BLUE SHIELD | Admitting: Obstetrics & Gynecology

## 2018-08-22 ENCOUNTER — Telehealth: Payer: Self-pay | Admitting: *Deleted

## 2018-08-22 NOTE — Telephone Encounter (Signed)
Spoke with patient. Patient is requesting to reschedule her IUD recheck with Dr. Hyacinth Meeker. Denies any concerns or complaints, would like OV as soon as possible. OV scheduled for 6/8 at 4:30pm with Dr. Hyacinth Meeker. Patient verbalizes understanding and is agreeable.   Routing to provider for final review. Patient is agreeable to disposition. Will close encounter.

## 2018-08-22 NOTE — Telephone Encounter (Signed)
Patient has been having some problems and would like to come in for iud check.

## 2018-08-23 ENCOUNTER — Other Ambulatory Visit: Payer: Self-pay

## 2018-08-27 ENCOUNTER — Ambulatory Visit: Payer: BC Managed Care – PPO | Admitting: Obstetrics & Gynecology

## 2018-08-27 ENCOUNTER — Other Ambulatory Visit: Payer: Self-pay

## 2018-08-27 ENCOUNTER — Encounter: Payer: Self-pay | Admitting: Obstetrics & Gynecology

## 2018-08-27 VITALS — BP 100/70 | HR 80 | Temp 98.2°F | Ht 64.25 in | Wt 210.0 lb

## 2018-08-27 DIAGNOSIS — N926 Irregular menstruation, unspecified: Secondary | ICD-10-CM | POA: Diagnosis not present

## 2018-08-27 DIAGNOSIS — Z30431 Encounter for routine checking of intrauterine contraceptive device: Secondary | ICD-10-CM

## 2018-08-27 LAB — POCT URINE PREGNANCY: Preg Test, Ur: NEGATIVE

## 2018-08-27 NOTE — Progress Notes (Signed)
GYNECOLOGY  VISIT  CC:   IUD check  HPI: 38 y.o. G79P0020 Married White or Caucasian female here for irregular bleeding with IUD.  This was placed 06/08/2018.  She had a five day cycle in April.  Bleeding stopped for about 30 days.  Then in May, her flow was about 2 weeks.  Flow wasn't heavy but just didn't seem to stop.  She's now just stopping and it is very light.  A little frustrated with bleeding.   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  Oldest is 19 yo, Almyra Free, and younger is 55.    GYNECOLOGIC HISTORY: Patient's last menstrual period was 08/05/2018 (approximate). Contraception: [name] placed 06/08/18  Menopausal hormone therapy: none  Patient Active Problem List   Diagnosis Date Noted  . DUB (dysfunctional uterine bleeding) 02/16/2016  . PCOS (polycystic ovarian syndrome) 02/16/2016  . Oligo-ovulation 02/16/2016  . PATELLO-FEMORAL SYNDROME 01/11/2008  . METATARSALGIA 01/11/2008  . UNEQUAL LEG LENGTH 01/11/2008    Past Medical History:  Diagnosis Date  . Migraine headache without aura 06/2010  . PCOS (polycystic ovarian syndrome)    suspected    Past Surgical History:  Procedure Laterality Date  . WISDOM TOOTH EXTRACTION  1995    MEDS:   Current Outpatient Medications on File Prior to Visit  Medication Sig Dispense Refill  . cholecalciferol (VITAMIN D3) 25 MCG (1000 UT) tablet Take 1,000 Units by mouth daily.    . Zinc 10 MG LOZG Use as directed in the mouth or throat.     No current facility-administered medications on file prior to visit.     ALLERGIES: Patient has no known allergies.  Family History  Problem Relation Age of Onset  . Bladder Cancer Father 5       bladder cancer  . Heart failure Maternal Grandmother   . Diabetes Maternal Grandmother   . Heart failure Paternal Grandmother   . Hyperlipidemia Paternal Grandmother   . Hypertension Paternal Grandmother   . Lung cancer Paternal Grandfather        smoker  . Prostate cancer  Paternal Grandfather     SH:  Married, non smoker  Review of Systems  Genitourinary: Positive for vaginal bleeding.       Spotting on and off   All other systems reviewed and are negative.   PHYSICAL EXAMINATION:    BP 100/70   Pulse 80   Temp 98.2 F (36.8 C) (Temporal)   Ht 5' 4.25" (1.632 m)   Wt 210 lb (95.3 kg)   LMP 08/05/2018 (Approximate)   BMI 35.77 kg/m     General appearance: alert, cooperative and appears stated age Lymph:  no inguinal LAD noted  Pelvic: External genitalia:  no lesions              Urethra:  normal appearing urethra with no masses, tenderness or lesions              Bartholins and Skenes: normal                 Vagina: normal appearing vagina with normal color and discharge, no lesions              Cervix: no lesions and IUD string noted, 2cm in length and unchanged since placement              Bimanual Exam:  Uterus:  normal size, contour, position, consistency, mobility, non-tender  Adnexa: no mass, fullness, tenderness  Chaperone was present for exam.  Assessment: Irregular bleeding with IUD Stressors at home  Plan: She is to expect her cycle again next week and if bleeding irregularity does not resolve, she is to call.  Will plan PUS at that time.  Pt comfortable with plan.   ~15 minutes spent with patient >50% of time was in face to face discussion of above.

## 2018-09-10 DIAGNOSIS — Z6836 Body mass index (BMI) 36.0-36.9, adult: Secondary | ICD-10-CM | POA: Diagnosis not present

## 2018-09-10 DIAGNOSIS — J329 Chronic sinusitis, unspecified: Secondary | ICD-10-CM | POA: Diagnosis not present

## 2018-09-10 DIAGNOSIS — H6692 Otitis media, unspecified, left ear: Secondary | ICD-10-CM | POA: Diagnosis not present

## 2018-09-19 DIAGNOSIS — J069 Acute upper respiratory infection, unspecified: Secondary | ICD-10-CM | POA: Diagnosis not present

## 2018-09-19 DIAGNOSIS — Z1159 Encounter for screening for other viral diseases: Secondary | ICD-10-CM | POA: Diagnosis not present

## 2019-01-18 DIAGNOSIS — Z20828 Contact with and (suspected) exposure to other viral communicable diseases: Secondary | ICD-10-CM | POA: Diagnosis not present

## 2019-02-19 DIAGNOSIS — Z0000 Encounter for general adult medical examination without abnormal findings: Secondary | ICD-10-CM | POA: Diagnosis not present

## 2019-02-19 DIAGNOSIS — Z13220 Encounter for screening for lipoid disorders: Secondary | ICD-10-CM | POA: Diagnosis not present

## 2019-02-19 DIAGNOSIS — Z1331 Encounter for screening for depression: Secondary | ICD-10-CM | POA: Diagnosis not present

## 2019-02-19 DIAGNOSIS — E669 Obesity, unspecified: Secondary | ICD-10-CM | POA: Diagnosis not present

## 2019-02-19 DIAGNOSIS — Z6837 Body mass index (BMI) 37.0-37.9, adult: Secondary | ICD-10-CM | POA: Diagnosis not present

## 2019-04-01 ENCOUNTER — Ambulatory Visit: Payer: BLUE CROSS/BLUE SHIELD | Admitting: Certified Nurse Midwife

## 2019-04-01 ENCOUNTER — Other Ambulatory Visit: Payer: Self-pay

## 2019-04-01 ENCOUNTER — Encounter: Payer: Self-pay | Admitting: Certified Nurse Midwife

## 2019-04-01 ENCOUNTER — Telehealth: Payer: Self-pay | Admitting: Obstetrics & Gynecology

## 2019-04-01 VITALS — BP 104/62 | HR 70 | Temp 97.1°F | Resp 16 | Wt 215.0 lb

## 2019-04-01 DIAGNOSIS — N390 Urinary tract infection, site not specified: Secondary | ICD-10-CM

## 2019-04-01 DIAGNOSIS — Z01419 Encounter for gynecological examination (general) (routine) without abnormal findings: Secondary | ICD-10-CM

## 2019-04-01 DIAGNOSIS — R319 Hematuria, unspecified: Secondary | ICD-10-CM

## 2019-04-01 LAB — POCT URINALYSIS DIPSTICK
Bilirubin, UA: NEGATIVE
Glucose, UA: NEGATIVE
Ketones, UA: NEGATIVE
Nitrite, UA: NEGATIVE
Protein, UA: NEGATIVE
Urobilinogen, UA: NEGATIVE E.U./dL — AB
pH, UA: 5 (ref 5.0–8.0)

## 2019-04-01 MED ORDER — NITROFURANTOIN MONOHYD MACRO 100 MG PO CAPS: 100.0000 mg | ORAL_CAPSULE | Freq: Two times a day (BID) | ORAL | 0 refills | Status: DC

## 2019-04-01 NOTE — Patient Instructions (Signed)

## 2019-04-01 NOTE — Telephone Encounter (Signed)
Spoke to pt. Pt states having UTI sx urgency, discomfort in lower abd pain that started yesterday. Was SA on Friday night. Pt has hx of UTIs. Pt wanting meds called in for UTI sx. Pt advised for OV since last AEX 04/2018 and has next AEX 04/26/2019 with Dr Hyacinth Meeker.  Declines appt with Dr Hyacinth Meeker 04/02/2019 due to work schedule. Pt scheduled to see Debbi, CNM today at 4pm. Pt agreeable.   Will route to D. Darcel Bayley, CNM for review and will close encounter.

## 2019-04-01 NOTE — Telephone Encounter (Signed)
Patient says she may have a uti and would like something called in if possible.

## 2019-04-01 NOTE — Progress Notes (Signed)
39 y.o. Married Caucasian female G2P0020 here with complaint of UTI, with onset  on Sunday.. Patient complaining of urinary frequency/urgency/ and pain with urination. Patient denies fever, chills, nausea or back pain. No new personal products. Patient feels related to sexual activity. Has had 4 UTI's in the last year.( all related to sexual activity). Was sexually active prior to occurrence. Denies any vaginal symptoms.  Contraception is [place] IUD. Questionable vaginal dryness. Patient drinking adequate water intake.  Review of Systems  Constitutional: Negative.   HENT: Negative.   Eyes: Negative.   Respiratory: Negative.   Cardiovascular: Negative.   Gastrointestinal: Negative.   Genitourinary: Positive for frequency and urgency.       Pressure  Musculoskeletal: Negative.   Skin: Negative.   Neurological: Negative.   Endo/Heme/Allergies: Negative.   Psychiatric/Behavioral: Negative.     O: Healthy female WDWN Affect: Normal, orientation x 3 Skin : warm and dry CVAT: negative bilateral Abdomen: positive for suprapubic tenderness  Pelvic exam: External genital area: normal, no lesions Bladder,Urethra tender, Urethral meatus: tender, red Vagina: normal vaginal discharge, normal appearance Cervix: normal, non tender Uterus:normal,non tender Adnexa: normal non tender, no fullness or masses  poct urine-wbc tr, rbc 1+  A: UTI Normal pelvic exam Post coital UTI Contraception [name] UTI  P: Reviewed findings of UTI and need for treatment. DE:YCXKGYJE 100 mg bid x 7 see instructions HUD:JSHFW micro, culture Reviewed warning signs and symptoms of UTI and need to advise if occurring. Will see if culture is E.Coli and will try preventive Macrobid use after sexual activity once infection has cleared. Encouraged to limit soda, tea, and coffee and be sure to increase water intake.   RV prn

## 2019-04-02 LAB — URINALYSIS, MICROSCOPIC ONLY
Casts: NONE SEEN /lpf
Epithelial Cells (non renal): >10 /hpf — AB (ref 0–10)

## 2019-04-03 LAB — URINE CULTURE: Organism ID, Bacteria: NO GROWTH

## 2019-04-05 ENCOUNTER — Telehealth: Payer: Self-pay | Admitting: Certified Nurse Midwife

## 2019-04-05 NOTE — Telephone Encounter (Signed)
Patient says she has blood in urine.

## 2019-04-05 NOTE — Telephone Encounter (Signed)
Spoke to pt. Pt states out of town. Pt states having urethral bleeding since x 1 wiping. Pt states has been taking pain meds like Tylenol and Motrin and pain/discomfort  as 1-2 on pain scale. Pt does state having a "clot" the size of pencil eraser after having BM and wiping. Claims it was urethral. Pt has hx of irregular bleeding after IUD insertion in 08/2018. Pt had OV last on 04/01/2019 with Debbi, CNM . Pt wanting recommendations. Will review with Debbi, CNM and will return call to pt. Pt agreeable.   Routing to [employer], CNM,

## 2019-04-05 NOTE — Telephone Encounter (Signed)
After speaking with Debbi, CNM. Spoke back with pt. Pt given update on sx. Debbi, CNM states might have passed urine crystal and had some bleeding since holding urine for out of town drive. Pt instructed to seek urgent care if having severe uncontrollable pain or spasms or more bleeding that soaks a pad  while out of town this weekend. Pt agreeable and verbalized understanding. Pt states will keep AEX with Dr Hyacinth Meeker on 04/26/19 for follow up unless is needing to be seen before for recheck of UTI sx. Pt encouraged to drink water and finish Macrobid. Pt agreeable.   Will route to D. Darcel Bayley, CNM for review and will close encounter.

## 2019-04-26 ENCOUNTER — Other Ambulatory Visit: Payer: Self-pay

## 2019-04-26 ENCOUNTER — Ambulatory Visit: Payer: BLUE CROSS/BLUE SHIELD | Admitting: Obstetrics & Gynecology

## 2019-04-26 ENCOUNTER — Encounter: Payer: Self-pay | Admitting: Obstetrics & Gynecology

## 2019-04-26 VITALS — BP 110/70 | HR 76 | Temp 97.7°F | Resp 12 | Ht 63.75 in | Wt 214.0 lb

## 2019-04-26 DIAGNOSIS — Z30432 Encounter for removal of intrauterine contraceptive device: Secondary | ICD-10-CM | POA: Diagnosis not present

## 2019-04-26 DIAGNOSIS — Z01419 Encounter for gynecological examination (general) (routine) without abnormal findings: Secondary | ICD-10-CM | POA: Diagnosis not present

## 2019-04-26 DIAGNOSIS — N926 Irregular menstruation, unspecified: Secondary | ICD-10-CM | POA: Diagnosis not present

## 2019-04-26 MED ORDER — MICROGESTIN 1/20 1-20 MG-MCG PO TABS: 1.0000 | ORAL_TABLET | Freq: Every day | ORAL | 4 refills | Status: DC

## 2019-04-26 NOTE — Progress Notes (Signed)
39 y.o. G54P0020 Married White or Caucasian female here for annual exam.  Malen Gauze daughter turned 6.  This has been a good decision for pt and her husband.  [name] is 14.  Relationship is improving.    Having bleeding about 7-8 days a months.  This is very frustrating for pt.  She would like her IUD removed today.  Feels much more hormonal with the IUD.  Did well with OCP and would like to go back to this.  Did take continuous active in the past.  Reviewed with pt.  She asks about hysterectomy as well.  Do not feel we really have indication for that at this time.  Patient's last menstrual period was 04/19/2019.          Sexually active: Yes.    The current method of family planning is IUD.    Exercising: No.  The patient does not participate in regular exercise at present. Smoker:  no  Health Maintenance: Pap:   12/21/17 Neg:Neg HR HPV  11/04/14 Neg. HR HPV: Neg  History of abnormal Pap:  no MMG:  none TDaP:  03/21/09 Screening Labs: PCP   reports that she has never smoked. She has never used smokeless tobacco. She reports current alcohol use. She reports that she does not use drugs.  Past Medical History:  Diagnosis Date  . Migraine headache without aura 06/2010  . PCOS (polycystic ovarian syndrome)    suspected    Past Surgical History:  Procedure Laterality Date  . WISDOM TOOTH EXTRACTION  1995    Current Outpatient Medications  Medication Sig Dispense Refill  . cholecalciferol (VITAMIN D3) 25 MCG (1000 UT) tablet Take 1,000 Units by mouth daily.    Marland Kitchen CRANBERRY PO Take by mouth daily.    . Zinc 10 MG LOZG Use as directed in the mouth or throat.     No current facility-administered medications for this visit.    Family History  Problem Relation Age of Onset  . Bladder Cancer Father 61       bladder cancer  . Heart failure Maternal Grandmother   . Diabetes Maternal Grandmother   . Heart failure Paternal Grandmother   . Hyperlipidemia Paternal Grandmother   . Hypertension  Paternal Grandmother   . Lung cancer Paternal Grandfather        smoker  . Prostate cancer Paternal Grandfather     Review of Systems  All other systems reviewed and are negative.   Exam:   BP 110/70 (BP Location: Right Arm, Patient Position: Sitting, Cuff Size: Normal)   Pulse 76   Temp 97.7 F (36.5 C) (Temporal)   Resp 12   Ht 5' 3.75" (1.619 m)   Wt 214 lb (97.1 kg)   LMP 04/19/2019   BMI 37.02 kg/m   Weight: +9#   Height: 5' 3.75" (161.9 cm)  Ht Readings from Last 3 Encounters:  04/26/19 5' 3.75" (1.619 m)  08/27/18 5' 4.25" (1.632 m)  06/08/18 5' 4.25" (1.632 m)    General appearance: alert, cooperative and appears stated age Head: Normocephalic, without obvious abnormality, atraumatic Neck: no adenopathy, supple, symmetrical, trachea midline and thyroid normal to inspection and palpation Lungs: clear to auscultation bilaterally Breasts: normal appearance, no masses or tenderness Heart: regular rate and rhythm Abdomen: soft, non-tender; bowel sounds normal; no masses,  no organomegaly Extremities: extremities normal, atraumatic, no cyanosis or edema Skin: Skin color, texture, turgor normal. No rashes or lesions Lymph nodes: Cervical, supraclavicular, and axillary nodes normal. No abnormal  inguinal nodes palpated Neurologic: Grossly normal   Pelvic: External genitalia:  no lesions              Urethra:  normal appearing urethra with no masses, tenderness or lesions              Bartholins and Skenes: normal                 Vagina: normal appearing vagina with normal color and discharge, no lesions              Cervix: no lesions              Pap taken: No. Bimanual Exam:  Uterus:  normal size, contour, position, consistency, mobility, non-tender              Adnexa: normal adnexa and no mass, fullness, tenderness               Rectovaginal: Confirms               Anus:  normal sphincter tone, no lesions  Procedure:  Verbal consent obtained for IUD removal.   After placement of speculum, IUD string noted, grasped and removed with one pull.  It was removed intact and discarded.  Pt did not want to see IUD but did tolerate procedure well.    Chaperone, Terence Lux, CMA, was present for exam.  A:  Well Woman with normal exam H/o recurrent pregnancy loss H/o PCOS Desires IUD removal due to prolonged cycles (not heavy) with IUD  P:   Mammogram guidelines reviewed.  Information given about scheduling screening MMG.  She will call if has any difficulty with this. pap smear with neg HR HPV 2019. Not indicated today. IUD removal occurred today Restart OCPs today, Microgestin DAW 1/20 1 po q day, continuous active.  #4/4RF Blood work done with PCP Tdap due per my records.  She will check with PCP as feels this may have been more recent due to foster care Return annually or prn

## 2019-04-26 NOTE — Patient Instructions (Addendum)
[facility] [address] [place], [place] [address]  Contact us [phone]  Hours of Operation Mon - Fri: 8 am - 5 pm  Check about last tdap (tetanus injection) and please let me know when it was done.

## 2019-06-05 DIAGNOSIS — F4321 Adjustment disorder with depressed mood: Secondary | ICD-10-CM | POA: Diagnosis not present

## 2019-06-12 ENCOUNTER — Encounter: Payer: Self-pay | Admitting: Certified Nurse Midwife

## 2019-07-03 DIAGNOSIS — F4321 Adjustment disorder with depressed mood: Secondary | ICD-10-CM | POA: Diagnosis not present

## 2019-07-17 DIAGNOSIS — F4321 Adjustment disorder with depressed mood: Secondary | ICD-10-CM | POA: Diagnosis not present

## 2019-07-25 ENCOUNTER — Telehealth: Payer: Self-pay

## 2019-07-25 DIAGNOSIS — N390 Urinary tract infection, site not specified: Secondary | ICD-10-CM

## 2019-07-25 NOTE — Telephone Encounter (Signed)
Patient is calling to speak with nurse regarding wanting a referral to a urologist for ongoing bladder problems.

## 2019-07-25 NOTE — Telephone Encounter (Signed)
Left message for pt to return call to triage RN. 

## 2019-07-25 NOTE — Telephone Encounter (Signed)
Ok for referral to Dr. Arita Miss at [facility].  This appt may be 6 weeks or so.  If desires, could discuss antibiotic therapy post coitally for prevention of UTIs.  This is not uncommonly done in gynecology.  Ok to wait for urology appt as well.

## 2019-07-25 NOTE — Telephone Encounter (Signed)
AEX 04/26/19 with SM H/O cystitis 03/2019 H/O bladder stones 4 UTIs in past year r/t SA LMP 07/07/19 on OCPs.    Spoke with patient. Patient reports having intermittent bladder spasms with frequency that has been occurring she is was a child, but now in the last 2-3 weeks has seen increase in sx and now has a dull ache in bladder that comes and go and is more significant since last night.  Pt states using , cranberry supplements and increasing water intake. Pt states started taking AZO x 3 days ago to help with sx. Pt states better today. Denies fever, chills, back pain, UTI sx of urgency or urinary burning, vaginal bleeding or discharge.  Pt advised to be seen for further evaluation due to current sx. Pt declines.  Pt requesting referral to urologist given her history.  Advised will review with Dr Hyacinth Meeker and return call to pt with recommendations. Pt agreeable.   Routing to Dr Hyacinth Meeker.

## 2019-07-26 ENCOUNTER — Encounter: Payer: Self-pay | Admitting: Obstetrics & Gynecology

## 2019-07-26 ENCOUNTER — Other Ambulatory Visit: Payer: Self-pay

## 2019-07-26 ENCOUNTER — Ambulatory Visit: Payer: BLUE CROSS/BLUE SHIELD | Admitting: Obstetrics & Gynecology

## 2019-07-26 VITALS — BP 140/72 | HR 86 | Temp 97.9°F | Wt 208.0 lb

## 2019-07-26 DIAGNOSIS — N938 Other specified abnormal uterine and vaginal bleeding: Secondary | ICD-10-CM | POA: Diagnosis not present

## 2019-07-26 DIAGNOSIS — R319 Hematuria, unspecified: Secondary | ICD-10-CM

## 2019-07-26 DIAGNOSIS — N3010 Interstitial cystitis (chronic) without hematuria: Secondary | ICD-10-CM | POA: Diagnosis not present

## 2019-07-26 DIAGNOSIS — R3129 Other microscopic hematuria: Secondary | ICD-10-CM

## 2019-07-26 DIAGNOSIS — N390 Urinary tract infection, site not specified: Secondary | ICD-10-CM | POA: Diagnosis not present

## 2019-07-26 LAB — POCT URINALYSIS DIPSTICK
Bilirubin, UA: NEGATIVE
Glucose, UA: NEGATIVE
Ketones, UA: NEGATIVE
Leukocytes, UA: NEGATIVE
Nitrite, UA: NEGATIVE
Protein, UA: NEGATIVE
Urobilinogen, UA: NEGATIVE E.U./dL — AB
pH, UA: 5 (ref 5.0–8.0)

## 2019-07-26 MED ORDER — NORETHINDRONE ACET-ETHINYL EST 1-20 MG-MCG PO TABS: 1.0000 | ORAL_TABLET | Freq: Every day | ORAL | 4 refills | Status: AC

## 2019-07-26 MED ORDER — SULFAMETHOXAZOLE-TRIMETHOPRIM 800-160 MG PO TABS: 1.0000 | ORAL_TABLET | Freq: Two times a day (BID) | ORAL | 0 refills | Status: DC

## 2019-07-26 MED ORDER — HYDROXYZINE HCL 25 MG PO TABS
25.0000 mg | ORAL_TABLET | Freq: Three times a day (TID) | ORAL | 1 refills | Status: DC | PRN
Start: 2019-07-26 — End: 2019-10-11

## 2019-07-26 MED ORDER — SULFAMETHOXAZOLE-TRIMETHOPRIM 800-160 MG PO TABS: 1.0000 | ORAL_TABLET | Freq: Two times a day (BID) | ORAL | 0 refills | Status: AC

## 2019-07-26 MED ORDER — HYDROXYZINE HCL 25 MG PO TABS: 25.0000 mg | ORAL_TABLET | Freq: Three times a day (TID) | ORAL | 1 refills | Status: DC | PRN

## 2019-07-26 NOTE — Telephone Encounter (Signed)
Spoke with pt. Pt given recommendations per Dr Hyacinth Meeker. Pt agreeable. Pt states ok with seeing Dr Arita Miss, but would also like to request Dr Retta Diones with whomever she can get into see first. Referral placed.  Advised pt Dr Hyacinth Meeker can discuss with her abx for post coital prevention of UTIs. Pt agreeable and is scheduled today at 4pm per pt's request due to out of town til 3pm today. Pt verbalized understanding. CPS neg.   Routing to Dr Hyacinth Meeker Encounter closed.  Cc: Rosa for referral

## 2019-07-26 NOTE — Progress Notes (Signed)
GYNECOLOGY  VISIT  CC:   UTI Symptoms   HPI: 39 y.o. G37P0020 Married White or Caucasian female here for dysuria and urinary urgency.  Pt is going on vacation next week to the beach and is worried she is not going to feel well while on vacation.  For several months, she's been experiencing urinary urgency and dysuria as well as feeling like intercourse worsens symptoms.  She was seen in January by [name] for similar symptoms.  Urine culture was negative.  Cranberry tablets were recommended.  Pt reports having intercourse recently and this seemed to flare her symptoms.  Also, she is continuing to have irregular bleeding.  Mentioned she may want to proceed with hysterectomy at last visit.  Encouraged her to give new method a little time to work.  She is on OCPs but still having a lot of spotting.   GYNECOLOGIC HISTORY: Patient's last menstrual period was 06/29/2019. Contraception: OCP Menopausal hormone therapy: none poct urine-rbc 2+  Patient Active Problem List   Diagnosis Date Noted  . DUB (dysfunctional uterine bleeding) 02/16/2016  . PCOS (polycystic ovarian syndrome) 02/16/2016  . Oligo-ovulation 02/16/2016  . PATELLO-FEMORAL SYNDROME 01/11/2008  . METATARSALGIA 01/11/2008  . UNEQUAL LEG LENGTH 01/11/2008    Past Medical History:  Diagnosis Date  . Migraine headache without aura 06/2010  . PCOS (polycystic ovarian syndrome)    suspected    Past Surgical History:  Procedure Laterality Date  . WISDOM TOOTH EXTRACTION  1995    MEDS:   Current Outpatient Medications on File Prior to Visit  Medication Sig Dispense Refill  . cholecalciferol (VITAMIN D3) 25 MCG (1000 UT) tablet Take 1,000 Units by mouth daily.    Marland Kitchen CRANBERRY PO Take by mouth daily.    . Zinc 10 MG LOZG Use as directed in the mouth or throat.     No current facility-administered medications on file prior to visit.    ALLERGIES: Patient has no known allergies.  Family History  Problem Relation Age of  Onset  . Bladder Cancer Father 61       bladder cancer  . Heart failure Maternal Grandmother   . Diabetes Maternal Grandmother   . Heart failure Paternal Grandmother   . Hyperlipidemia Paternal Grandmother   . Hypertension Paternal Grandmother   . Lung cancer Paternal Grandfather        smoker  . Prostate cancer Paternal Grandfather     SH:  Married, non smoker  Review of Systems  Gastrointestinal: Positive for abdominal pain.  Genitourinary: Positive for frequency and urgency.  All other systems reviewed and are negative.   PHYSICAL EXAMINATION:    BP 140/72 (BP Location: Right Arm, Patient Position: Sitting, Cuff Size: Normal)   Pulse 86   Temp 97.9 F (36.6 C) (Skin)   Wt 208 lb (94.3 kg)   LMP 06/29/2019   SpO2 99%   BMI 35.98 kg/m     General appearance: alert, cooperative and appears stated age Abd:  Soft, non tender, no suprapubic pain Lymph:  no inguinal LAD noted  Pelvic: External genitalia:  no lesions              Urethra:  normal appearing urethra with no masses, tenderness or lesions              Bartholins and Skenes: normal                 Vagina: normal appearing vagina with normal color and discharge, no lesions  Cervix: no lesions              Bimanual Exam:  Uterus:  normal size, contour, position, consistency, mobility, non-tender              Adnexa: no mass, fullness, tenderness   Chaperone, Cornelia Copa, CMA, was present for exam.  Assessment: Urinary frequency and at times dysuria that is consistent with I.C. (may be worsened by being on cranberry tablets) Doubtful of cystitis Continued irregular bleeding Microscopic hematuria  Plan: Urine culture pending.  Urine micro pending as well. Bactrim DS BID x 3 days D/w pt I.C and possible relation to cranberry tablets and other foods.  Elimination diet information given. Will start Atarax 25mg  nightly.  Pt aware can take more often but will start with nightly in case causes her to  be sleepy. Urology referral has been made at her request May need to consider PUS (last was 1/19)   About 35 minutes total spent with pt

## 2019-07-27 LAB — URINALYSIS, MICROSCOPIC ONLY
Bacteria, UA: NONE SEEN
Casts: NONE SEEN /lpf

## 2019-07-28 LAB — URINE CULTURE

## 2019-07-29 ENCOUNTER — Telehealth: Payer: Self-pay

## 2019-07-29 NOTE — Telephone Encounter (Signed)
Left message for call back.

## 2019-07-29 NOTE — Telephone Encounter (Signed)
-----   Message from Mary S Miller, MD sent at 07/29/2019 10:39 AM EDT ----- Please notify pt her urine culture was negative.  She should have finished antibiotics this morning.  Could you see how her symptoms are?  I think she has interstitial cystitis.  We discussed this Friday.  Has been referred to urology.   

## 2019-07-30 NOTE — Telephone Encounter (Signed)
Left message for call back.

## 2019-08-01 NOTE — Telephone Encounter (Signed)
Patient returned a call to Joy. °

## 2019-08-01 NOTE — Telephone Encounter (Signed)
Left message for call back.

## 2019-08-02 ENCOUNTER — Other Ambulatory Visit: Payer: Self-pay | Admitting: Obstetrics & Gynecology

## 2019-08-02 ENCOUNTER — Telehealth: Payer: Self-pay | Admitting: Obstetrics & Gynecology

## 2019-08-02 DIAGNOSIS — N926 Irregular menstruation, unspecified: Secondary | ICD-10-CM

## 2019-08-02 NOTE — Telephone Encounter (Signed)
-----   Message from Jerene Bears, MD sent at 07/29/2019 10:39 AM EDT ----- Please notify pt her urine culture was negative.  She should have finished antibiotics this morning.  Could you see how her symptoms are?  I think she has interstitial cystitis.  We discussed this Friday.  Has been referred to urology.

## 2019-08-02 NOTE — Telephone Encounter (Signed)
Could you please let her know I do want her to come back for ultrasound and we will call about this.  Should be done if we are moving towards hysterectomy.  Also, have sent this to scheduling and business office.  Urology referral was placed last week so asked Rosa to follow up on this for her as well.  Thanks.

## 2019-08-02 NOTE — Telephone Encounter (Signed)
Call placed to convey appointment details for [facility] referral. Spoke with the patient and conveyed the appointment details. Patient is aware to call them directly if she needs to change the appointment.

## 2019-08-02 NOTE — Telephone Encounter (Signed)
Patient notified & states she feels somewhat better & will await a call regarding urology referral. Patient asking about hysterectomy. She said you all spoke about it briefly.

## 2019-08-02 NOTE — Telephone Encounter (Signed)
Left patient a message letting her know that Dr Hyacinth Meeker sent a message to triage regarding it.

## 2019-08-02 NOTE — Telephone Encounter (Signed)
Left patient a message letting patient know.

## 2019-08-05 ENCOUNTER — Telehealth: Payer: Self-pay | Admitting: Obstetrics & Gynecology

## 2019-08-05 NOTE — Telephone Encounter (Signed)
Spoke with patient regarding surgery benefits. Patient acknowledges understanding of information presented. Patient is aware that benefits presented are for professional benefits only. Patient is aware that once surgery is scheduled, the hospital will call with separate benefits. Patient is aware of surgery cancellation policy.  Patient stated that she would talk everything over with her husband and return call next week regarding how she would like to proceed.  Diamond Hunter- patient is requesting a call back to go over clinical information and surgery details.

## 2019-08-06 NOTE — Telephone Encounter (Signed)
Spoke with patient. All questions answered regarding recovery time and how long she will need to be out of work. Patient will speak with her spouse and return call to discuss scheduling.

## 2019-08-07 DIAGNOSIS — F4321 Adjustment disorder with depressed mood: Secondary | ICD-10-CM | POA: Diagnosis not present

## 2019-08-14 ENCOUNTER — Telehealth: Payer: Self-pay

## 2019-08-14 NOTE — Telephone Encounter (Signed)
Please see telephone encounter dated 08/05/2019. Encounter closed.

## 2019-08-14 NOTE — Telephone Encounter (Signed)
Routing to [employer], Ship broker

## 2019-08-14 NOTE — Telephone Encounter (Signed)
Patient called in regards to cancelling ultrasound for tomorrow (08/15/19). Patient did not wish to reschedule at this time. Patient stated it was an [place] for surgery and is going to push the the surgery date out.

## 2019-08-14 NOTE — Telephone Encounter (Signed)
Left message to call Na Waldrip at [phone]. 

## 2019-08-15 ENCOUNTER — Other Ambulatory Visit: Payer: BLUE CROSS/BLUE SHIELD

## 2019-08-15 ENCOUNTER — Other Ambulatory Visit: Payer: BLUE CROSS/BLUE SHIELD | Admitting: Obstetrics & Gynecology

## 2019-08-21 NOTE — Telephone Encounter (Signed)
Spoke with patient. Patient would like to wait until Dec 13th or 14th for surgery due to patient and husbands work schedule. Patient is still taking Microgestin. Reports she is still not having regular menses. Reports having BTB during the month. Denies any heavy bleeding. Patient would like to continue on Microgestin until surgery. Patient will call if she develops any new symptoms or has any heavy bleeding. Patient is aware we will contact her around September with new benefits and to get surgery scheduled. Patient verbalizes understanding.   Dr.Miller will patient need anything additional until surgery?

## 2019-08-22 DIAGNOSIS — F4321 Adjustment disorder with depressed mood: Secondary | ICD-10-CM | POA: Diagnosis not present

## 2019-09-06 NOTE — Telephone Encounter (Signed)
I never responded to this message.  Nothing else is needed until we get closer to the winter.  Ok to close encounter.

## 2019-09-09 DIAGNOSIS — F4321 Adjustment disorder with depressed mood: Secondary | ICD-10-CM | POA: Diagnosis not present

## 2019-09-16 DIAGNOSIS — R8271 Bacteriuria: Secondary | ICD-10-CM | POA: Diagnosis not present

## 2019-09-16 DIAGNOSIS — R3982 Chronic bladder pain: Secondary | ICD-10-CM | POA: Diagnosis not present

## 2019-09-25 DIAGNOSIS — F4321 Adjustment disorder with depressed mood: Secondary | ICD-10-CM | POA: Diagnosis not present

## 2019-10-11 ENCOUNTER — Telehealth: Payer: Self-pay | Admitting: Obstetrics & Gynecology

## 2019-10-11 DIAGNOSIS — F4321 Adjustment disorder with depressed mood: Secondary | ICD-10-CM | POA: Diagnosis not present

## 2019-10-11 MED ORDER — HYDROXYZINE HCL 25 MG PO TABS: 25.0000 mg | ORAL_TABLET | Freq: Every evening | ORAL | 2 refills | Status: AC | PRN

## 2019-10-11 NOTE — Telephone Encounter (Signed)
Call to patient, left detailed message, ok per dpr. Advised patient to return call to office to clarify frequency and dosage of hydroxyzine.

## 2019-10-11 NOTE — Telephone Encounter (Signed)
Rx sent to pharmacy on file.  Thanks. 

## 2019-10-11 NOTE — Telephone Encounter (Signed)
Last AEX 04/26/19 Next AEX 07/24/20  Routing to Dr. Hyacinth Meeker to review message and advise on refill request.

## 2019-10-11 NOTE — Telephone Encounter (Signed)
Spoke with patient. Patient reports she is taking one hydroxyzine tab (25 mg) 2-3 times per week at hs.   Routing to Dr. Hyacinth Meeker.

## 2019-10-11 NOTE — Telephone Encounter (Signed)
Patient is returning call.  °

## 2019-10-11 NOTE — Telephone Encounter (Signed)
Can you see how she is taking this so I can update the prescription.  I doubt she is using it three times daily.  Thanks.

## 2019-10-11 NOTE — Telephone Encounter (Signed)
Patient was prescribed hydroxyzine and did follow up with a urologist. The urologist would like her to to continue with the hydroxyzine. She asked if Dr.Miller would approve the refills? [facility], [address]

## 2019-11-04 DIAGNOSIS — F4321 Adjustment disorder with depressed mood: Secondary | ICD-10-CM | POA: Diagnosis not present

## 2019-11-06 DIAGNOSIS — R3982 Chronic bladder pain: Secondary | ICD-10-CM | POA: Diagnosis not present

## 2019-12-18 DIAGNOSIS — F4321 Adjustment disorder with depressed mood: Secondary | ICD-10-CM | POA: Diagnosis not present

## 2020-01-22 DIAGNOSIS — F4321 Adjustment disorder with depressed mood: Secondary | ICD-10-CM | POA: Diagnosis not present

## 2020-01-22 IMAGING — US US OB < 14 WEEKS - US OB TV
1 series · 15 of 28 positions shown · non-contrast
Comparison: None.

CLINICAL DATA: Pregnant patient.  Spotting.

EXAM:
OBSTETRIC <14 WK US AND TRANSVAGINAL OB US
TECHNIQUE: Both transabdominal and transvaginal ultrasound examinations were
performed for complete evaluation of the gestation as well as the
maternal uterus, adnexal regions, and pelvic cul-de-sac.
Transvaginal technique was performed to assess early pregnancy.

[Series 1: us ob < 14 weeks - us ob tv · 57 acquisitions, 15 frames shown]
[im 1/57]
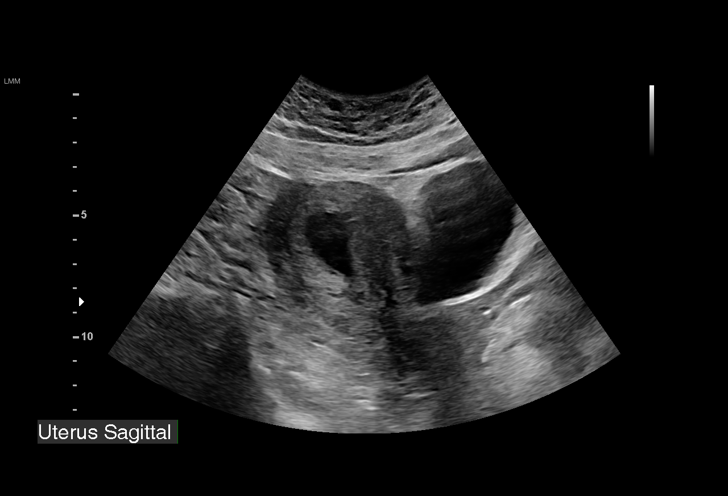
[im 5/57]
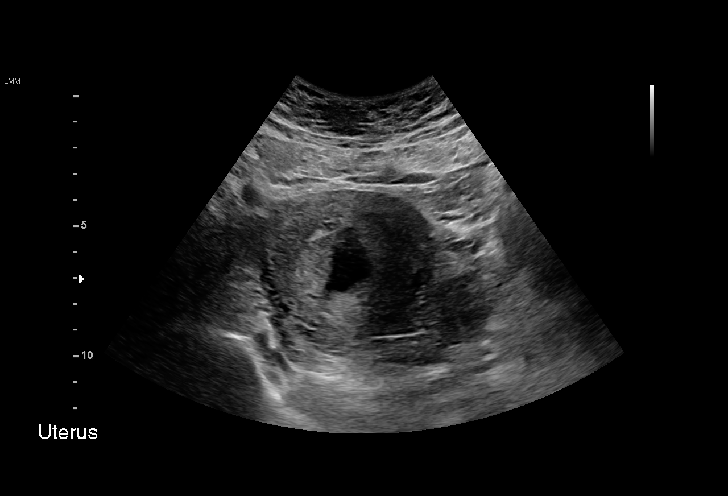
[im 9/57]
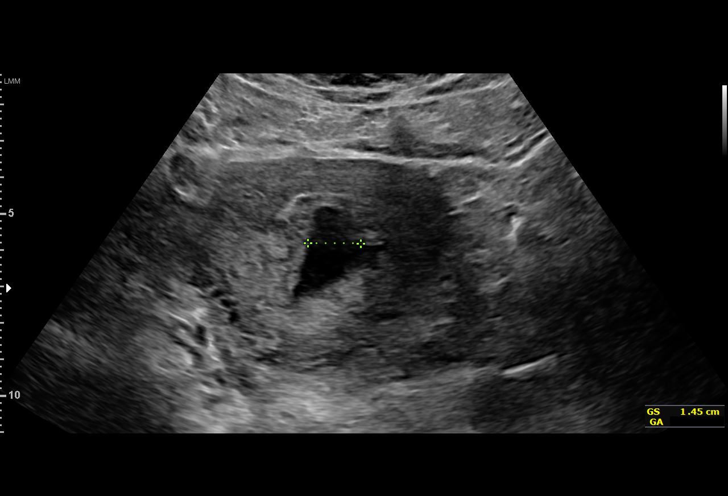
[im 13/57]
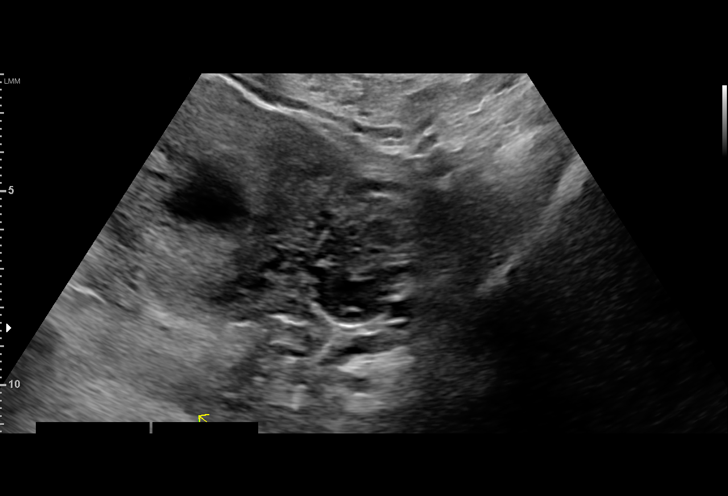
[im 17/57]
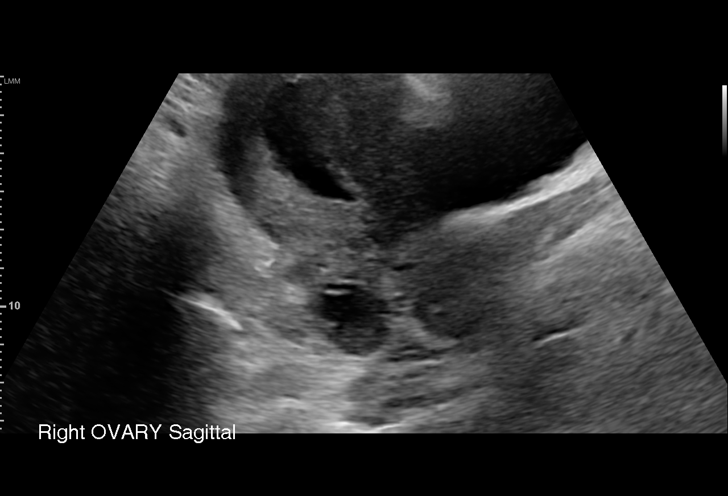
[im 21/57]
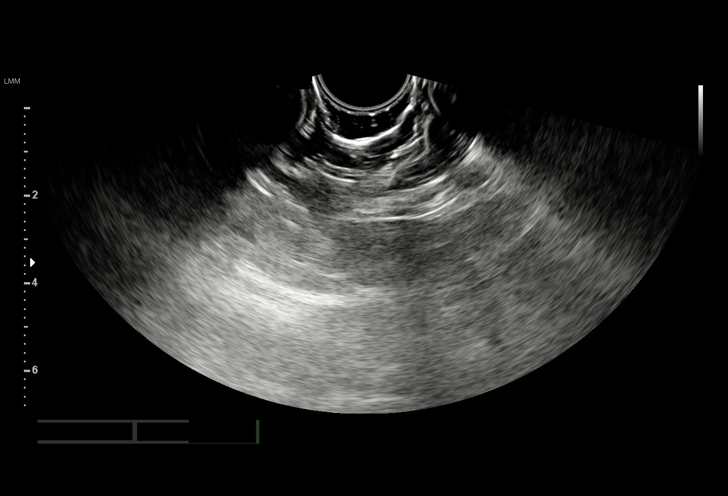
[im 25/57]
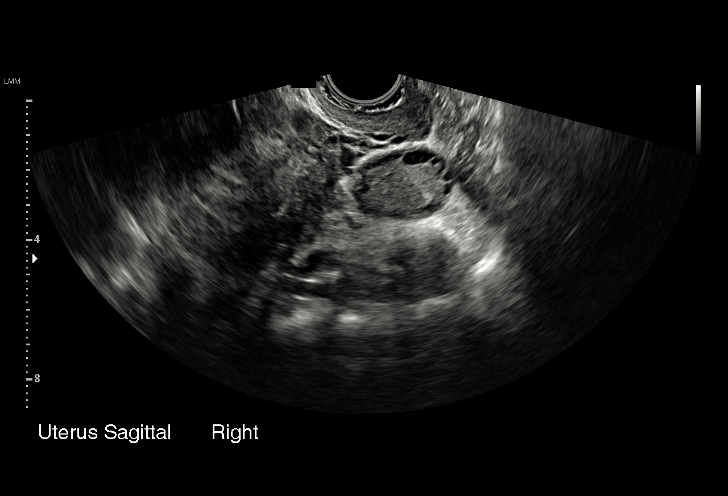
[im 30/57]
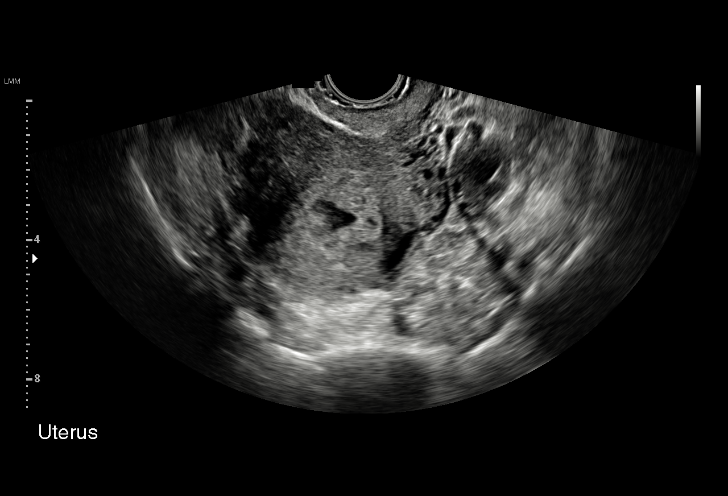
[im 32/57]
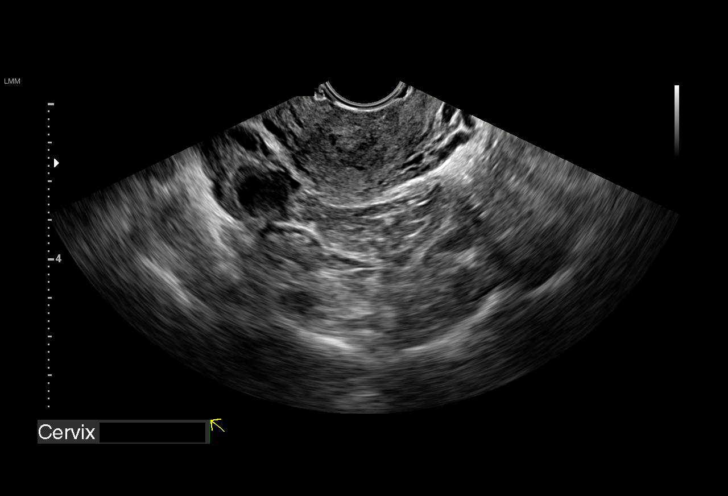
[im 36/57]
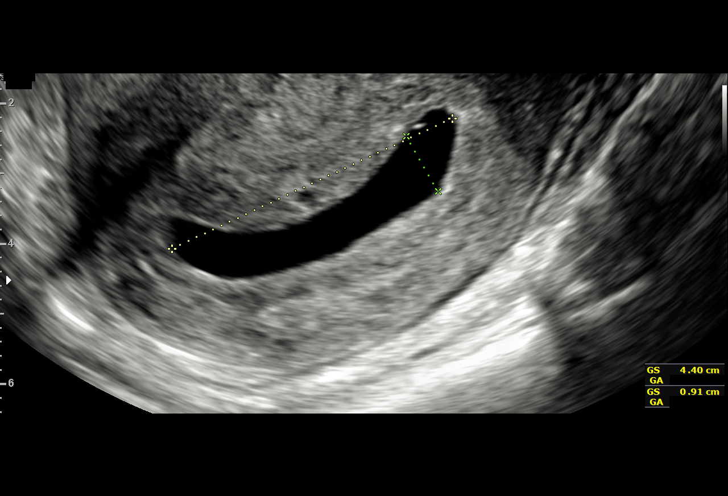
[im 40/57]
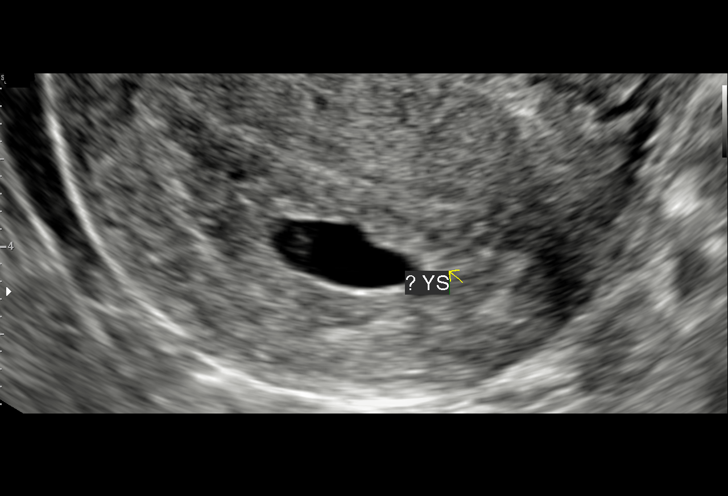
[im 44/57]
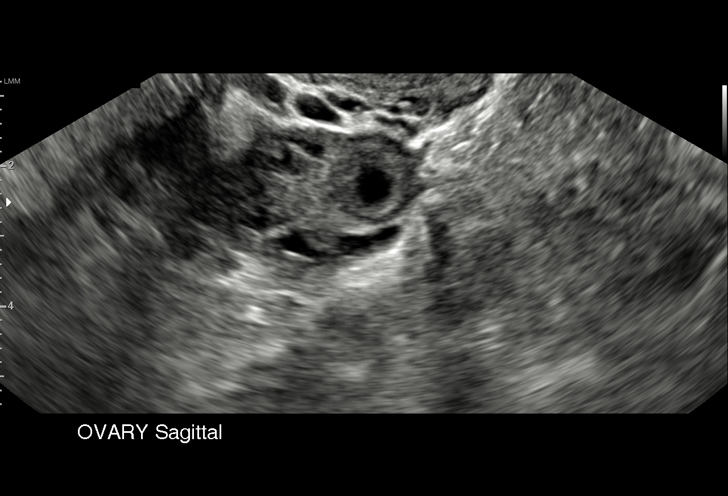
[im 48/57]
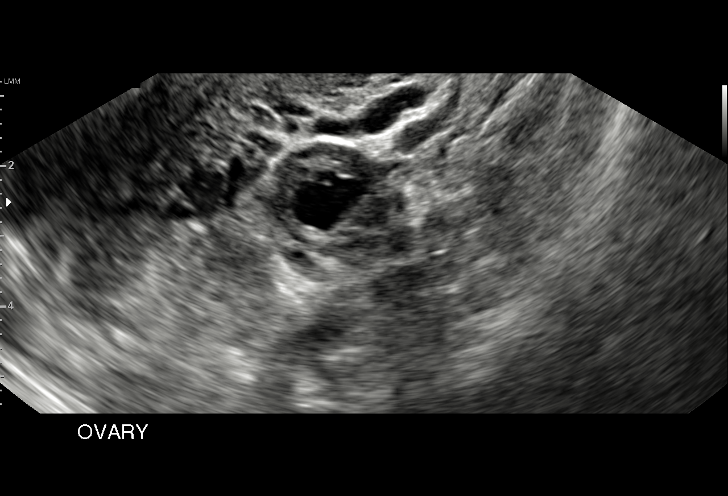
[im 52/57]
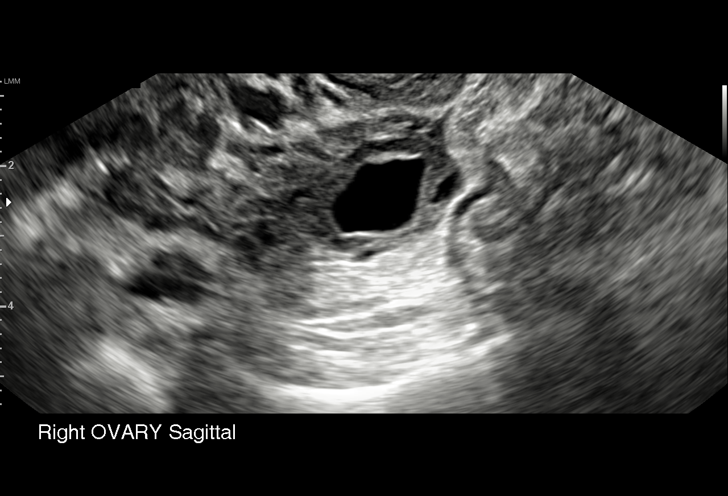
[im 57/57]
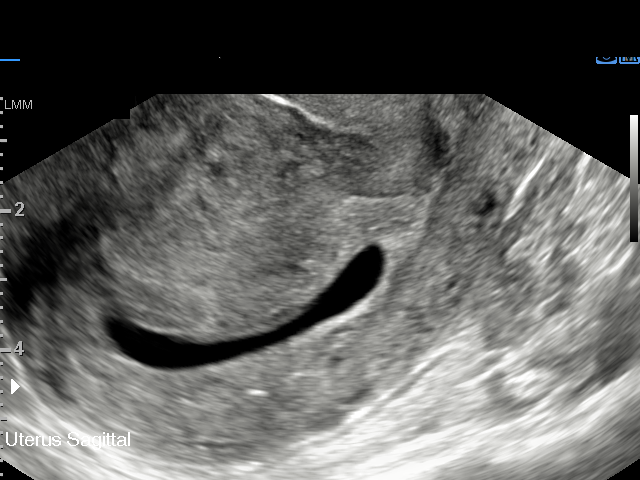

[15 of 28 positions shown; findings below may reference images not displayed]

FINDINGS: Intrauterine gestational sac: There is an oval fluid collection in
the endometrium with a mean diameter of 2.4 cm.

Yolk sac:  Possible subtle yolk sac.  Not definitive.

Embryo:  Not Visualized.

MSD: 24 mm   7 w   to d

CRL:    mm    w    d                  US EDC:

Subchorionic hemorrhage:  None visualized.

Maternal uterus/adnexae: Bilateral corpus luteum cyst. No other
abnormalities.
IMPRESSION: 1. There is a probable gestational sac with a mean diameter of 24 mm
within the endometrial canal. There may be a subtle yolk sac but
this is not definitive. No fetal pole identified. Findings are
suspicious but not yet definitive for failed pregnancy. Recommend
follow-up US in 10-14 days for definitive diagnosis. This
recommendation follows [facility] consensus guidelines: Diagnostic Criteria
for Nonviable Pregnancy Early in the First Trimester. N Engl J Med

## 2020-01-28 DIAGNOSIS — Z1231 Encounter for screening mammogram for malignant neoplasm of breast: Secondary | ICD-10-CM | POA: Diagnosis not present

## 2020-03-04 DIAGNOSIS — F4321 Adjustment disorder with depressed mood: Secondary | ICD-10-CM | POA: Diagnosis not present

## 2020-04-16 DIAGNOSIS — F4321 Adjustment disorder with depressed mood: Secondary | ICD-10-CM | POA: Diagnosis not present

## 2020-05-27 DIAGNOSIS — E559 Vitamin D deficiency, unspecified: Secondary | ICD-10-CM | POA: Diagnosis not present

## 2020-05-27 DIAGNOSIS — E282 Polycystic ovarian syndrome: Secondary | ICD-10-CM | POA: Diagnosis not present

## 2020-05-27 DIAGNOSIS — Z1329 Encounter for screening for other suspected endocrine disorder: Secondary | ICD-10-CM | POA: Diagnosis not present

## 2020-05-27 DIAGNOSIS — N926 Irregular menstruation, unspecified: Secondary | ICD-10-CM | POA: Diagnosis not present

## 2020-05-27 DIAGNOSIS — R350 Frequency of micturition: Secondary | ICD-10-CM | POA: Diagnosis not present

## 2020-05-27 DIAGNOSIS — R6889 Other general symptoms and signs: Secondary | ICD-10-CM | POA: Diagnosis not present

## 2020-05-27 DIAGNOSIS — E669 Obesity, unspecified: Secondary | ICD-10-CM | POA: Diagnosis not present

## 2020-05-27 DIAGNOSIS — E538 Deficiency of other specified B group vitamins: Secondary | ICD-10-CM | POA: Diagnosis not present

## 2020-05-27 DIAGNOSIS — R8290 Unspecified abnormal findings in urine: Secondary | ICD-10-CM | POA: Diagnosis not present

## 2020-05-28 DIAGNOSIS — F4321 Adjustment disorder with depressed mood: Secondary | ICD-10-CM | POA: Diagnosis not present

## 2020-06-25 DIAGNOSIS — E282 Polycystic ovarian syndrome: Secondary | ICD-10-CM | POA: Diagnosis not present

## 2020-06-25 DIAGNOSIS — N926 Irregular menstruation, unspecified: Secondary | ICD-10-CM | POA: Diagnosis not present

## 2020-06-25 DIAGNOSIS — L680 Hirsutism: Secondary | ICD-10-CM | POA: Diagnosis not present

## 2020-06-25 DIAGNOSIS — E669 Obesity, unspecified: Secondary | ICD-10-CM | POA: Diagnosis not present

## 2020-07-08 DIAGNOSIS — F4321 Adjustment disorder with depressed mood: Secondary | ICD-10-CM | POA: Diagnosis not present

## 2020-07-24 ENCOUNTER — Ambulatory Visit: Payer: BLUE CROSS/BLUE SHIELD

## 2020-08-27 DIAGNOSIS — F4321 Adjustment disorder with depressed mood: Secondary | ICD-10-CM | POA: Diagnosis not present

## 2020-11-12 DIAGNOSIS — F4321 Adjustment disorder with depressed mood: Secondary | ICD-10-CM | POA: Diagnosis not present

## 2021-03-25 DIAGNOSIS — Z1231 Encounter for screening mammogram for malignant neoplasm of breast: Secondary | ICD-10-CM | POA: Diagnosis not present

## 2021-05-10 DIAGNOSIS — Z01419 Encounter for gynecological examination (general) (routine) without abnormal findings: Secondary | ICD-10-CM | POA: Diagnosis not present

## 2021-10-28 DIAGNOSIS — H5213 Myopia, bilateral: Secondary | ICD-10-CM | POA: Diagnosis not present

## 2022-02-07 DIAGNOSIS — J329 Chronic sinusitis, unspecified: Secondary | ICD-10-CM | POA: Diagnosis not present

## 2022-02-07 DIAGNOSIS — J302 Other seasonal allergic rhinitis: Secondary | ICD-10-CM | POA: Diagnosis not present

## 2022-02-07 DIAGNOSIS — J40 Bronchitis, not specified as acute or chronic: Secondary | ICD-10-CM | POA: Diagnosis not present

## 2022-08-11 DIAGNOSIS — O09299 Supervision of pregnancy with other poor reproductive or obstetric history, unspecified trimester: Secondary | ICD-10-CM | POA: Diagnosis not present

## 2022-08-17 DIAGNOSIS — O4691 Antepartum hemorrhage, unspecified, first trimester: Secondary | ICD-10-CM | POA: Diagnosis not present

## 2022-08-17 DIAGNOSIS — Z3A01 Less than 8 weeks gestation of pregnancy: Secondary | ICD-10-CM | POA: Diagnosis not present

## 2022-08-17 DIAGNOSIS — O4690 Antepartum hemorrhage, unspecified, unspecified trimester: Secondary | ICD-10-CM | POA: Diagnosis not present

## 2022-08-24 DIAGNOSIS — Z3689 Encounter for other specified antenatal screening: Secondary | ICD-10-CM | POA: Diagnosis not present

## 2022-08-24 DIAGNOSIS — Z3A01 Less than 8 weeks gestation of pregnancy: Secondary | ICD-10-CM | POA: Diagnosis not present

## 2022-08-24 DIAGNOSIS — Z369 Encounter for antenatal screening, unspecified: Secondary | ICD-10-CM | POA: Diagnosis not present

## 2022-08-24 DIAGNOSIS — O3680X0 Pregnancy with inconclusive fetal viability, not applicable or unspecified: Secondary | ICD-10-CM | POA: Diagnosis not present

## 2022-09-14 DIAGNOSIS — E612 Magnesium deficiency: Secondary | ICD-10-CM | POA: Diagnosis not present

## 2022-09-14 DIAGNOSIS — E559 Vitamin D deficiency, unspecified: Secondary | ICD-10-CM | POA: Diagnosis not present

## 2022-09-14 DIAGNOSIS — E282 Polycystic ovarian syndrome: Secondary | ICD-10-CM | POA: Diagnosis not present

## 2022-09-14 DIAGNOSIS — Z1589 Genetic susceptibility to other disease: Secondary | ICD-10-CM | POA: Diagnosis not present

## 2022-09-15 DIAGNOSIS — O09521 Supervision of elderly multigravida, first trimester: Secondary | ICD-10-CM | POA: Diagnosis not present

## 2022-09-15 DIAGNOSIS — Z3A10 10 weeks gestation of pregnancy: Secondary | ICD-10-CM | POA: Diagnosis not present

## 2022-09-28 DIAGNOSIS — Z3402 Encounter for supervision of normal first pregnancy, second trimester: Secondary | ICD-10-CM | POA: Diagnosis not present

## 2022-09-28 DIAGNOSIS — Z31430 Encounter of female for testing for genetic disease carrier status for procreative management: Secondary | ICD-10-CM | POA: Diagnosis not present

## 2022-11-24 DIAGNOSIS — Z3A20 20 weeks gestation of pregnancy: Secondary | ICD-10-CM | POA: Diagnosis not present

## 2022-11-24 DIAGNOSIS — Z3689 Encounter for other specified antenatal screening: Secondary | ICD-10-CM | POA: Diagnosis not present

## 2023-01-10 DIAGNOSIS — H5213 Myopia, bilateral: Secondary | ICD-10-CM | POA: Diagnosis not present

## 2023-01-19 DIAGNOSIS — Z3492 Encounter for supervision of normal pregnancy, unspecified, second trimester: Secondary | ICD-10-CM | POA: Diagnosis not present

## 2023-01-19 DIAGNOSIS — Z3A28 28 weeks gestation of pregnancy: Secondary | ICD-10-CM | POA: Diagnosis not present

## 2023-01-19 DIAGNOSIS — O09523 Supervision of elderly multigravida, third trimester: Secondary | ICD-10-CM | POA: Diagnosis not present

## 2023-01-19 DIAGNOSIS — Z3689 Encounter for other specified antenatal screening: Secondary | ICD-10-CM | POA: Diagnosis not present

## 2023-01-27 DIAGNOSIS — R7302 Impaired glucose tolerance (oral): Secondary | ICD-10-CM | POA: Diagnosis not present

## 2023-02-11 DIAGNOSIS — O368130 Decreased fetal movements, third trimester, not applicable or unspecified: Secondary | ICD-10-CM | POA: Diagnosis not present

## 2023-02-11 DIAGNOSIS — Z3A32 32 weeks gestation of pregnancy: Secondary | ICD-10-CM | POA: Diagnosis not present

## 2023-02-15 DIAGNOSIS — O99213 Obesity complicating pregnancy, third trimester: Secondary | ICD-10-CM | POA: Diagnosis not present

## 2023-02-23 DIAGNOSIS — O09523 Supervision of elderly multigravida, third trimester: Secondary | ICD-10-CM | POA: Diagnosis not present

## 2023-02-23 DIAGNOSIS — O99213 Obesity complicating pregnancy, third trimester: Secondary | ICD-10-CM | POA: Diagnosis not present

## 2023-02-23 DIAGNOSIS — Z3A33 33 weeks gestation of pregnancy: Secondary | ICD-10-CM | POA: Diagnosis not present

## 2023-03-02 DIAGNOSIS — O09521 Supervision of elderly multigravida, first trimester: Secondary | ICD-10-CM | POA: Diagnosis not present

## 2023-03-16 DIAGNOSIS — Z3493 Encounter for supervision of normal pregnancy, unspecified, third trimester: Secondary | ICD-10-CM | POA: Diagnosis not present
